# Patient Record
Sex: Male | Born: 1975 | Race: Black or African American | Hispanic: No | Marital: Single | Smoking: Current every day smoker
Health system: Southern US, Community
[De-identification: ages and names within clinical notes are randomized; demographics above are authoritative.]

## PROBLEM LIST (undated history)

## (undated) DIAGNOSIS — F250 Schizoaffective disorder, bipolar type: Secondary | ICD-10-CM

## (undated) DIAGNOSIS — W3400XA Accidental discharge from unspecified firearms or gun, initial encounter: Secondary | ICD-10-CM

## (undated) DIAGNOSIS — F209 Schizophrenia, unspecified: Secondary | ICD-10-CM

## (undated) DIAGNOSIS — E119 Type 2 diabetes mellitus without complications: Secondary | ICD-10-CM

## (undated) HISTORY — PX: OTHER SURGICAL HISTORY: SHX169

---

## 2005-05-01 ENCOUNTER — Emergency Department: Payer: Self-pay | Admitting: Emergency Medicine

## 2006-04-27 ENCOUNTER — Inpatient Hospital Stay: Payer: Self-pay | Admitting: Unknown Physician Specialty

## 2007-10-04 ENCOUNTER — Emergency Department: Payer: Self-pay | Admitting: Emergency Medicine

## 2007-12-24 ENCOUNTER — Inpatient Hospital Stay: Payer: Self-pay | Admitting: Unknown Physician Specialty

## 2008-09-08 ENCOUNTER — Inpatient Hospital Stay: Payer: Self-pay | Admitting: Unknown Physician Specialty

## 2008-10-31 ENCOUNTER — Inpatient Hospital Stay: Payer: Self-pay | Admitting: Psychiatry

## 2009-05-13 ENCOUNTER — Ambulatory Visit: Payer: Self-pay

## 2010-02-02 ENCOUNTER — Inpatient Hospital Stay: Payer: Self-pay | Admitting: Psychiatry

## 2010-03-28 ENCOUNTER — Inpatient Hospital Stay: Payer: Self-pay | Admitting: Psychiatry

## 2012-04-29 ENCOUNTER — Emergency Department: Payer: Self-pay | Admitting: Emergency Medicine

## 2012-04-29 LAB — COMPREHENSIVE METABOLIC PANEL
Albumin: 3.6 g/dL (ref 3.4–5.0)
Alkaline Phosphatase: 86 U/L (ref 50–136)
Anion Gap: 18 — ABNORMAL HIGH (ref 7–16)
BUN: 13 mg/dL (ref 7–18)
Bilirubin,Total: 0.2 mg/dL (ref 0.2–1.0)
Calcium, Total: 8 mg/dL — ABNORMAL LOW (ref 8.5–10.1)
Chloride: 110 mmol/L — ABNORMAL HIGH (ref 98–107)
EGFR (African American): 58 — ABNORMAL LOW
SGOT(AST): 179 U/L — ABNORMAL HIGH (ref 15–37)
SGPT (ALT): 167 U/L — ABNORMAL HIGH
Sodium: 145 mmol/L (ref 136–145)

## 2012-04-29 LAB — CBC WITH DIFFERENTIAL/PLATELET
Basophil #: 0.2 10*3/uL — ABNORMAL HIGH (ref 0.0–0.1)
Eosinophil #: 0 10*3/uL (ref 0.0–0.7)
HCT: 39.8 % — ABNORMAL LOW (ref 40.0–52.0)
HGB: 13.3 g/dL (ref 13.0–18.0)
Lymphocyte #: 3.7 10*3/uL — ABNORMAL HIGH (ref 1.0–3.6)
MCHC: 33.6 g/dL (ref 32.0–36.0)
MCV: 87 fL (ref 80–100)
Monocyte #: 0.6 x10 3/mm (ref 0.2–1.0)
Monocyte %: 8 %
Neutrophil %: 43.5 %
Platelet: 193 10*3/uL (ref 150–440)
WBC: 8.1 10*3/uL (ref 3.8–10.6)

## 2012-04-29 LAB — APTT: Activated PTT: 31.4 secs (ref 23.6–35.9)

## 2012-04-29 LAB — PROTIME-INR: INR: 1

## 2012-05-07 ENCOUNTER — Emergency Department: Payer: Self-pay | Admitting: Emergency Medicine

## 2012-05-07 LAB — CBC
MCH: 27.1 pg (ref 26.0–34.0)
MCHC: 31.9 g/dL — ABNORMAL LOW (ref 32.0–36.0)
MCV: 85 fL (ref 80–100)
Platelet: 251 10*3/uL (ref 150–440)
RBC: 3.4 10*6/uL — ABNORMAL LOW (ref 4.40–5.90)
RDW: 15.4 % — ABNORMAL HIGH (ref 11.5–14.5)
WBC: 13.5 10*3/uL — ABNORMAL HIGH (ref 3.8–10.6)

## 2012-05-07 LAB — URINALYSIS, COMPLETE
Bacteria: NONE SEEN
Leukocyte Esterase: NEGATIVE
Protein: NEGATIVE
Specific Gravity: 1.017 (ref 1.003–1.030)
Squamous Epithelial: 1
WBC UR: 10 /HPF (ref 0–5)

## 2012-05-07 LAB — COMPREHENSIVE METABOLIC PANEL
Alkaline Phosphatase: 137 U/L — ABNORMAL HIGH (ref 50–136)
BUN: 11 mg/dL (ref 7–18)
Calcium, Total: 8.3 mg/dL — ABNORMAL LOW (ref 8.5–10.1)
Creatinine: 1.06 mg/dL (ref 0.60–1.30)
Potassium: 4.3 mmol/L (ref 3.5–5.1)
SGOT(AST): 51 U/L — ABNORMAL HIGH (ref 15–37)

## 2012-05-11 ENCOUNTER — Emergency Department: Payer: Self-pay | Admitting: Emergency Medicine

## 2012-05-11 LAB — URINALYSIS, COMPLETE
Bilirubin,UR: NEGATIVE
Ketone: NEGATIVE
Nitrite: NEGATIVE
RBC,UR: 1 /HPF (ref 0–5)
Specific Gravity: 1.009 (ref 1.003–1.030)

## 2012-05-11 LAB — CBC WITH DIFFERENTIAL/PLATELET
Eosinophil %: 0.6 %
HCT: 27.4 % — ABNORMAL LOW (ref 40.0–52.0)
HGB: 9.2 g/dL — ABNORMAL LOW (ref 13.0–18.0)
Lymphocyte #: 1.4 10*3/uL (ref 1.0–3.6)
Lymphocyte %: 10.8 %
MCHC: 33.5 g/dL (ref 32.0–36.0)
MCV: 84 fL (ref 80–100)
Monocyte #: 1.6 x10 3/mm — ABNORMAL HIGH (ref 0.2–1.0)
Neutrophil #: 9.7 10*3/uL — ABNORMAL HIGH (ref 1.4–6.5)
Neutrophil %: 75.2 %
RBC: 3.27 10*6/uL — ABNORMAL LOW (ref 4.40–5.90)
RDW: 15.5 % — ABNORMAL HIGH (ref 11.5–14.5)
WBC: 12.9 10*3/uL — ABNORMAL HIGH (ref 3.8–10.6)

## 2012-05-11 LAB — COMPREHENSIVE METABOLIC PANEL
Albumin: 2.3 g/dL — ABNORMAL LOW (ref 3.4–5.0)
Alkaline Phosphatase: 147 U/L — ABNORMAL HIGH (ref 50–136)
Bilirubin,Total: 0.5 mg/dL (ref 0.2–1.0)
Calcium, Total: 8.3 mg/dL — ABNORMAL LOW (ref 8.5–10.1)
EGFR (Non-African Amer.): >60
Glucose: 110 mg/dL — ABNORMAL HIGH (ref 65–99)
Potassium: 4.5 mmol/L (ref 3.5–5.1)
SGOT(AST): 57 U/L — ABNORMAL HIGH (ref 15–37)
SGPT (ALT): 74 U/L
Sodium: 137 mmol/L (ref 136–145)
Total Protein: 7.1 g/dL (ref 6.4–8.2)

## 2012-05-13 LAB — CULTURE, BLOOD (SINGLE)

## 2012-05-16 ENCOUNTER — Emergency Department: Payer: Self-pay | Admitting: Emergency Medicine

## 2012-05-17 LAB — CBC
HGB: 10.3 g/dL — ABNORMAL LOW (ref 13.0–18.0)
MCH: 27.6 pg (ref 26.0–34.0)
MCV: 81 fL (ref 80–100)
Platelet: 519 10*3/uL — ABNORMAL HIGH (ref 150–440)
RBC: 3.72 10*6/uL — ABNORMAL LOW (ref 4.40–5.90)
RDW: 16.1 % — ABNORMAL HIGH (ref 11.5–14.5)
WBC: 10.1 10*3/uL (ref 3.8–10.6)

## 2012-05-17 LAB — COMPREHENSIVE METABOLIC PANEL
Albumin: 2.8 g/dL — ABNORMAL LOW (ref 3.4–5.0)
Bilirubin,Total: 0.3 mg/dL (ref 0.2–1.0)
Chloride: 101 mmol/L (ref 98–107)
Co2: 27 mmol/L (ref 21–32)
EGFR (African American): >60
Glucose: 111 mg/dL — ABNORMAL HIGH (ref 65–99)
SGOT(AST): 26 U/L (ref 15–37)
Sodium: 137 mmol/L (ref 136–145)
Total Protein: 8.1 g/dL (ref 6.4–8.2)

## 2012-05-17 LAB — CULTURE, BLOOD (SINGLE)

## 2012-05-25 ENCOUNTER — Emergency Department: Payer: Self-pay | Admitting: Emergency Medicine

## 2012-05-25 LAB — COMPREHENSIVE METABOLIC PANEL
Anion Gap: 7 (ref 7–16)
BUN: 10 mg/dL (ref 7–18)
Bilirubin,Total: 0.6 mg/dL (ref 0.2–1.0)
Chloride: 105 mmol/L (ref 98–107)
Co2: 26 mmol/L (ref 21–32)
Creatinine: 1.08 mg/dL (ref 0.60–1.30)
EGFR (African American): >60
EGFR (Non-African Amer.): >60
Potassium: 3.6 mmol/L (ref 3.5–5.1)
Total Protein: 7.8 g/dL (ref 6.4–8.2)

## 2012-05-25 LAB — URINALYSIS, COMPLETE
Bacteria: NONE SEEN
Bilirubin,UR: NEGATIVE
Leukocyte Esterase: NEGATIVE
Ph: 5 (ref 4.5–8.0)
Protein: NEGATIVE
RBC,UR: 2 /HPF (ref 0–5)
Squamous Epithelial: NONE SEEN

## 2012-05-25 LAB — CBC WITH DIFFERENTIAL/PLATELET
Basophil %: 0.7 %
Eosinophil #: 0.1 10*3/uL (ref 0.0–0.7)
Eosinophil %: 1 %
HGB: 11.4 g/dL — ABNORMAL LOW (ref 13.0–18.0)
Lymphocyte #: 1.7 10*3/uL (ref 1.0–3.6)
Lymphocyte %: 17.1 %
MCV: 81 fL (ref 80–100)
Monocyte %: 12 %
Neutrophil #: 6.9 10*3/uL — ABNORMAL HIGH (ref 1.4–6.5)
WBC: 10 10*3/uL (ref 3.8–10.6)

## 2012-07-05 ENCOUNTER — Emergency Department: Payer: Self-pay | Admitting: Emergency Medicine

## 2012-07-05 LAB — COMPREHENSIVE METABOLIC PANEL
Alkaline Phosphatase: 103 U/L (ref 50–136)
Anion Gap: 11 (ref 7–16)
EGFR (African American): >60
EGFR (Non-African Amer.): >60
Glucose: 120 mg/dL — ABNORMAL HIGH (ref 65–99)
Potassium: 3.3 mmol/L — ABNORMAL LOW (ref 3.5–5.1)
SGOT(AST): 20 U/L (ref 15–37)
SGPT (ALT): 19 U/L (ref 12–78)
Sodium: 142 mmol/L (ref 136–145)
Total Protein: 7.7 g/dL (ref 6.4–8.2)

## 2012-07-05 LAB — CBC
HCT: 39.8 % — ABNORMAL LOW
HGB: 13.6 g/dL
MCH: 26.6 pg
MCHC: 34.1 g/dL
MCV: 78 fL — ABNORMAL LOW
Platelet: 189 x10 3/mm 3
RBC: 5.11 x10 6/mm 3
RDW: 19.2 % — ABNORMAL HIGH
WBC: 9.6 x10 3/mm 3

## 2012-07-05 LAB — URINALYSIS, COMPLETE
Bacteria: NONE SEEN
Bilirubin,UR: NEGATIVE
Glucose,UR: NEGATIVE mg/dL (ref 0–75)
Ketone: NEGATIVE
Leukocyte Esterase: NEGATIVE
Protein: NEGATIVE
RBC,UR: 1 /HPF (ref 0–5)
Specific Gravity: 1.014 (ref 1.003–1.030)
Squamous Epithelial: NONE SEEN
WBC UR: 1 /HPF (ref 0–5)

## 2012-07-05 LAB — LIPASE, BLOOD: Lipase: 170 U/L

## 2012-07-30 LAB — DRUG SCREEN, URINE
Barbiturates, Ur Screen: NEGATIVE (ref ?–200)
Cannabinoid 50 Ng, Ur ~~LOC~~: POSITIVE (ref ?–50)
Cocaine Metabolite,Ur ~~LOC~~: POSITIVE (ref ?–300)
Opiate, Ur Screen: NEGATIVE (ref ?–300)
Tricyclic, Ur Screen: NEGATIVE (ref ?–1000)

## 2012-07-30 LAB — COMPREHENSIVE METABOLIC PANEL
Albumin: 4.3 g/dL (ref 3.4–5.0)
Alkaline Phosphatase: 89 U/L (ref 50–136)
Anion Gap: 10 (ref 7–16)
Calcium, Total: 9.3 mg/dL (ref 8.5–10.1)
Chloride: 103 mmol/L (ref 98–107)
Co2: 24 mmol/L (ref 21–32)
EGFR (African American): >60
EGFR (Non-African Amer.): >60
Glucose: 90 mg/dL (ref 65–99)
Potassium: 3.6 mmol/L (ref 3.5–5.1)
SGOT(AST): 22 U/L (ref 15–37)
Total Protein: 8.6 g/dL — ABNORMAL HIGH (ref 6.4–8.2)

## 2012-07-30 LAB — CBC
HCT: 43.3 % (ref 40.0–52.0)
HGB: 14.9 g/dL (ref 13.0–18.0)
MCV: 78 fL — ABNORMAL LOW (ref 80–100)
Platelet: 231 10*3/uL (ref 150–440)
RBC: 5.59 10*6/uL (ref 4.40–5.90)
RDW: 20.6 % — ABNORMAL HIGH (ref 11.5–14.5)
WBC: 10.7 10*3/uL — ABNORMAL HIGH (ref 3.8–10.6)

## 2012-07-30 LAB — ETHANOL: Ethanol %: <0.003 % (ref 0.000–0.080)

## 2012-07-30 LAB — SALICYLATE LEVEL: Salicylates, Serum: <1.7 mg/dL

## 2012-07-31 ENCOUNTER — Inpatient Hospital Stay: Payer: Self-pay | Admitting: Psychiatry

## 2012-07-31 LAB — URINALYSIS, COMPLETE
Bilirubin,UR: NEGATIVE
Blood: NEGATIVE
Ketone: NEGATIVE
Leukocyte Esterase: NEGATIVE
Nitrite: NEGATIVE
Ph: 5 (ref 4.5–8.0)
RBC,UR: NONE SEEN /HPF (ref 0–5)
Specific Gravity: 1.015 (ref 1.003–1.030)
Squamous Epithelial: NONE SEEN

## 2012-08-01 LAB — BEHAVIORAL MEDICINE 1 PANEL
Albumin: 3.2 g/dL — ABNORMAL LOW (ref 3.4–5.0)
Alkaline Phosphatase: 93 U/L (ref 50–136)
BUN: 12 mg/dL (ref 7–18)
Basophil %: 0.8 %
Bilirubin,Total: 0.4 mg/dL (ref 0.2–1.0)
Chloride: 108 mmol/L — ABNORMAL HIGH (ref 98–107)
Co2: 23 mmol/L (ref 21–32)
Creatinine: 1.05 mg/dL (ref 0.60–1.30)
Eosinophil %: 3 %
Glucose: 101 mg/dL — ABNORMAL HIGH (ref 65–99)
HCT: 39.2 % — ABNORMAL LOW (ref 40.0–52.0)
HGB: 13.2 g/dL (ref 13.0–18.0)
Lymphocyte #: 2.4 10*3/uL (ref 1.0–3.6)
MCH: 26.3 pg (ref 26.0–34.0)
Monocyte %: 10.3 %
Neutrophil #: 2.5 10*3/uL (ref 1.4–6.5)
Neutrophil %: 43.7 %
Osmolality: 279 (ref 275–301)
Platelet: 167 10*3/uL (ref 150–440)
Potassium: 3.7 mmol/L (ref 3.5–5.1)
RBC: 5 10*6/uL (ref 4.40–5.90)
SGOT(AST): 22 U/L (ref 15–37)
Sodium: 140 mmol/L (ref 136–145)
WBC: 5.7 10*3/uL (ref 3.8–10.6)

## 2012-09-01 ENCOUNTER — Inpatient Hospital Stay: Payer: Self-pay | Admitting: Psychiatry

## 2012-09-01 LAB — CBC
MCH: 27.2 pg (ref 26.0–34.0)
MCHC: 34.6 g/dL (ref 32.0–36.0)
MCV: 79 fL — ABNORMAL LOW (ref 80–100)
Platelet: 227 10*3/uL (ref 150–440)
RBC: 5.3 10*6/uL (ref 4.40–5.90)
RDW: 20.8 % — ABNORMAL HIGH (ref 11.5–14.5)
WBC: 8.3 10*3/uL (ref 3.8–10.6)

## 2012-09-01 LAB — URINALYSIS, COMPLETE
Bilirubin,UR: NEGATIVE
Glucose,UR: 50 mg/dL (ref 0–75)
Leukocyte Esterase: NEGATIVE
Nitrite: NEGATIVE
Ph: 6 (ref 4.5–8.0)
Protein: >=500
RBC,UR: 8 /HPF (ref 0–5)
Squamous Epithelial: 2
WBC UR: 19 /HPF (ref 0–5)
White Blood Cell Cast: 11

## 2012-09-01 LAB — DRUG SCREEN, URINE
Amphetamines, Ur Screen: NEGATIVE (ref ?–1000)
Barbiturates, Ur Screen: NEGATIVE (ref ?–200)
Benzodiazepine, Ur Scrn: NEGATIVE (ref ?–200)
Cannabinoid 50 Ng, Ur ~~LOC~~: POSITIVE (ref ?–50)
Methadone, Ur Screen: NEGATIVE (ref ?–300)
Opiate, Ur Screen: NEGATIVE (ref ?–300)
Phencyclidine (PCP) Ur S: NEGATIVE (ref ?–25)
Tricyclic, Ur Screen: NEGATIVE (ref ?–1000)

## 2012-09-01 LAB — COMPREHENSIVE METABOLIC PANEL
Albumin: 4.3 g/dL (ref 3.4–5.0)
Alkaline Phosphatase: 87 U/L (ref 50–136)
Anion Gap: 12 (ref 7–16)
BUN: 12 mg/dL (ref 7–18)
Co2: 21 mmol/L (ref 21–32)
EGFR (African American): 58 — ABNORMAL LOW
Glucose: 114 mg/dL — ABNORMAL HIGH (ref 65–99)
Potassium: 3.3 mmol/L — ABNORMAL LOW (ref 3.5–5.1)
SGOT(AST): 24 U/L (ref 15–37)
SGPT (ALT): 18 U/L (ref 12–78)
Sodium: 141 mmol/L (ref 136–145)
Total Protein: 8.3 g/dL — ABNORMAL HIGH (ref 6.4–8.2)

## 2012-09-01 LAB — ETHANOL
Ethanol %: <0.003 % (ref 0.000–0.080)
Ethanol: <3 mg/dL

## 2012-09-02 DIAGNOSIS — I499 Cardiac arrhythmia, unspecified: Secondary | ICD-10-CM

## 2012-09-02 LAB — BASIC METABOLIC PANEL
Anion Gap: 8 (ref 7–16)
BUN: 12 mg/dL (ref 7–18)
Calcium, Total: 8.9 mg/dL (ref 8.5–10.1)
Chloride: 110 mmol/L — ABNORMAL HIGH (ref 98–107)
Creatinine: 1.29 mg/dL (ref 0.60–1.30)
EGFR (African American): >60
EGFR (Non-African Amer.): >60
Glucose: 103 mg/dL — ABNORMAL HIGH (ref 65–99)
Osmolality: 283 (ref 275–301)
Potassium: 3.8 mmol/L (ref 3.5–5.1)

## 2012-09-02 LAB — LIPID PANEL
Cholesterol: 120 mg/dL (ref 0–200)
Triglycerides: 99 mg/dL (ref 0–200)
VLDL Cholesterol, Calc: 20 mg/dL (ref 5–40)

## 2012-09-02 LAB — CK: CK, Total: 299 U/L — ABNORMAL HIGH (ref 35–232)

## 2012-09-02 LAB — FOLATE: Folic Acid: 12.9 ng/mL (ref 3.1–100.0)

## 2012-09-03 LAB — URINALYSIS, COMPLETE
Bacteria: NONE SEEN
Bilirubin,UR: NEGATIVE
Glucose,UR: NEGATIVE mg/dL (ref 0–75)
Nitrite: NEGATIVE
Ph: 5 (ref 4.5–8.0)
Protein: NEGATIVE
RBC,UR: <1 /HPF (ref 0–5)
Specific Gravity: 1.014 (ref 1.003–1.030)
Squamous Epithelial: NONE SEEN

## 2013-02-04 ENCOUNTER — Inpatient Hospital Stay: Payer: Self-pay | Admitting: Psychiatry

## 2013-02-04 LAB — COMPREHENSIVE METABOLIC PANEL
Albumin: 4.2 g/dL (ref 3.4–5.0)
BUN: 16 mg/dL (ref 7–18)
Bilirubin,Total: 0.5 mg/dL (ref 0.2–1.0)
Chloride: 106 mmol/L (ref 98–107)
Co2: 23 mmol/L (ref 21–32)
Creatinine: 1.41 mg/dL — ABNORMAL HIGH (ref 0.60–1.30)
EGFR (African American): >60
EGFR (Non-African Amer.): >60
Sodium: 136 mmol/L (ref 136–145)
Total Protein: 8.3 g/dL — ABNORMAL HIGH (ref 6.4–8.2)

## 2013-02-04 LAB — URINALYSIS, COMPLETE
Bacteria: NONE SEEN
Nitrite: NEGATIVE
Ph: 5 (ref 4.5–8.0)
Protein: NEGATIVE
Specific Gravity: 1.012 (ref 1.003–1.030)
Squamous Epithelial: NONE SEEN

## 2013-02-04 LAB — ETHANOL
Ethanol %: 0.007 % (ref 0.000–0.080)
Ethanol: 7 mg/dL

## 2013-02-04 LAB — DRUG SCREEN, URINE
Amphetamines, Ur Screen: NEGATIVE (ref ?–1000)
Barbiturates, Ur Screen: NEGATIVE (ref ?–200)
Benzodiazepine, Ur Scrn: NEGATIVE (ref ?–200)
Cannabinoid 50 Ng, Ur ~~LOC~~: POSITIVE (ref ?–50)
Cocaine Metabolite,Ur ~~LOC~~: POSITIVE (ref ?–300)
MDMA (Ecstasy)Ur Screen: NEGATIVE (ref ?–500)
Methadone, Ur Screen: NEGATIVE (ref ?–300)
Opiate, Ur Screen: NEGATIVE (ref ?–300)
Phencyclidine (PCP) Ur S: NEGATIVE (ref ?–25)
Tricyclic, Ur Screen: NEGATIVE (ref ?–1000)

## 2013-02-04 LAB — SALICYLATE LEVEL: Salicylates, Serum: 3 mg/dL — ABNORMAL HIGH

## 2013-02-04 LAB — CBC
HCT: 43.8 % (ref 40.0–52.0)
HGB: 15 g/dL (ref 13.0–18.0)
MCH: 29.2 pg (ref 26.0–34.0)
MCHC: 34.3 g/dL (ref 32.0–36.0)
MCV: 85 fL (ref 80–100)
Platelet: 199 10*3/uL (ref 150–440)
RBC: 5.16 10*6/uL (ref 4.40–5.90)
RDW: 14.9 % — ABNORMAL HIGH (ref 11.5–14.5)

## 2013-02-04 LAB — ACETAMINOPHEN LEVEL: Acetaminophen: <2 ug/mL

## 2013-02-04 LAB — TSH: Thyroid Stimulating Horm: 3.54 u[IU]/mL
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
## 2013-05-01 ENCOUNTER — Emergency Department: Payer: Self-pay | Admitting: Emergency Medicine

## 2013-06-05 LAB — CBC
MCHC: 34.5 g/dL (ref 32.0–36.0)
MCV: 85 fL (ref 80–100)
RBC: 4.84 10*6/uL (ref 4.40–5.90)
RDW: 14.8 % — ABNORMAL HIGH (ref 11.5–14.5)

## 2013-06-05 LAB — DRUG SCREEN, URINE
Amphetamines, Ur Screen: NEGATIVE (ref ?–1000)
Barbiturates, Ur Screen: NEGATIVE (ref ?–200)
Benzodiazepine, Ur Scrn: NEGATIVE (ref ?–200)
Cannabinoid 50 Ng, Ur ~~LOC~~: POSITIVE (ref ?–50)
MDMA (Ecstasy)Ur Screen: NEGATIVE (ref ?–500)
Methadone, Ur Screen: NEGATIVE (ref ?–300)
Phencyclidine (PCP) Ur S: NEGATIVE (ref ?–25)
Tricyclic, Ur Screen: NEGATIVE (ref ?–1000)

## 2013-06-05 LAB — URINALYSIS, COMPLETE
Bacteria: NONE SEEN
Bilirubin,UR: NEGATIVE
Ketone: NEGATIVE
Nitrite: NEGATIVE
Ph: 5 (ref 4.5–8.0)
Protein: NEGATIVE
RBC,UR: 1 /HPF (ref 0–5)
Squamous Epithelial: NONE SEEN
WBC UR: 2 /HPF (ref 0–5)

## 2013-06-05 LAB — COMPREHENSIVE METABOLIC PANEL
Alkaline Phosphatase: 79 U/L (ref 50–136)
Bilirubin,Total: 0.5 mg/dL (ref 0.2–1.0)
Calcium, Total: 9.1 mg/dL (ref 8.5–10.1)
Creatinine: 1.34 mg/dL — ABNORMAL HIGH (ref 0.60–1.30)
EGFR (African American): >60
EGFR (Non-African Amer.): >60
Glucose: 92 mg/dL (ref 65–99)
Potassium: 3.5 mmol/L (ref 3.5–5.1)
SGOT(AST): 19 U/L (ref 15–37)
SGPT (ALT): 21 U/L (ref 12–78)
Sodium: 139 mmol/L (ref 136–145)
Total Protein: 8.2 g/dL (ref 6.4–8.2)

## 2013-06-05 LAB — ETHANOL
Ethanol %: <0.003 % (ref 0.000–0.080)
Ethanol: <3 mg/dL

## 2013-06-05 LAB — TSH: Thyroid Stimulating Horm: 2.57 u[IU]/mL

## 2013-06-05 LAB — ACETAMINOPHEN LEVEL: Acetaminophen: <2 ug/mL

## 2013-06-06 ENCOUNTER — Inpatient Hospital Stay: Payer: Self-pay | Admitting: Psychiatry

## 2013-07-24 ENCOUNTER — Emergency Department: Payer: Self-pay | Admitting: Emergency Medicine

## 2013-10-05 ENCOUNTER — Emergency Department: Payer: Self-pay | Admitting: Emergency Medicine
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
## 2015-03-16 NOTE — Consult Note (Signed)
PATIENT NAME:  Cory PortelaMCLEAN, Cory J MR#:  [id number] DATE OF BIRTH:  [birth year]  DATE OF CONSULTATION:  06/06/2013  PSYCHIATRIC NOTE  REFERRING PHYSICIAN:    CONSULTING PHYSICIAN:  Izola PriceFrances C. Jaclynn MajorGreason, MD  HISTORY: Mr. [name] was seen in consultation on 06/05/2013 by Dr. Guss Bundehalla in the ED. He is a 39 year old male with a long history of mental problems. He reports that he [place] and he has not been taking his medicines. He has recently moved back to [place] and is living with his uncle, so he can keep his follow up appointments with [facility]are [facility]Lavine [facility]. He had come to the ED because he was hearing voices telling him to hurt himself because he is no good. He has a long history of psychosis and he has a history of suicide attempts. He confirms feeling depressed on interview, hearing voices, telling him he is no good. Having poor sleep, feeling apathetic and depressed. He has an idea and plan to OD by taking pills. His behavior is unpredictable. During the time I am talking to him, he has a blanket over his head. His insight and judgment are poor.   DIAGNOSES: Polysubstance abuse, psychosis not otherwise specified.   RECOMMENDATION:  Inpatient [facility] when a bed is available.    ____________________________ Izola PriceFrances C. Jaclynn MajorGreason, MD fcg:rw D: 06/06/2013 30:86:[id number]:28:24 ET T: 06/06/2013 16:58:50 ET JOB#: [id number]  cc: Izola PriceFrances C. Jaclynn MajorGreason, MD, <Dictator> Maryan PulsFRANCES C Marice Guidone MD ELECTRONICALLY SIGNED 06/08/2013 14:41

## 2015-03-16 NOTE — H&P (Signed)
PATIENT NAME:  Cory Pruitt, Cory Pruitt MR#:  [id number] DATE OF BIRTH:  08-Aug-1976  DATE OF ADMISSION:  06/06/2013  DATE OF ASSESSMENT:  06/07/2013  REFERRING PHYSICIAN:  Emergency Room MD.   ATTENDING PHYSICIAN:  Kristine LineaJolanta Aunya Lemler, MD.   IDENTIFYING DATA: Mr. Cory Pruitt is a 39 year old male with history of schizoaffective disorder and substance abuse.   CHIEF COMPLAINT:  "I need my medication."  HISTORY OF PRESENT ILLNESS:  Mr. Cory Pruitt reports that he has been off his medication since April or May 2014. He lives in [place] now and has had difficulties attending his appointments. Overall, he had done fairly well, but now he feels increasingly depressed and is afraid that without medication, he would slip into deep depression. He reports periods of time when he feels on the top of the world lasting about a week, followed by depressed mood, when he is unable to get out of bed, have any social interaction, periods of depression usually associated with a craving for substances, mostly cocaine. The patient is positive for metabolites of cocaine and cannabis on admission. He denies excessive alcohol use.  At the time of admission, the patient voiced some suicidal ideation but now he explains that this was a way to get to the hospital to get medications rather to be treated for depression. He wants to restart the Geodon but even more so he is interested in taking medication that comes as an injectable long-lasting form. At the moment he denies any thoughts of hurting himself or others. There are no psychotic symptoms yet. There are no symptoms suggestive of bipolar mania.   PAST PSYCHIATRIC HISTORY:  He was diagnosed with schizoaffective disorder in 2004. He was [facility] admitted to [facility]. He was treated with Risperdal that worked well for him, but made him sedated. He had numerous, about 10 admissions to the hospital, mostly [facility] but also [facility]. He is  admitted to the hospital mostly in the context of medication noncompliance and substance use. He has been tried on several medications including Geodon, Depakote, trazodone, Zyprexa. He recently was given a 7-day supply of medication that he does not remember the name of. He has never been treated for substance abuse and does not believe that this is a problem as long as he is on medication for his schizoaffective disorder.  In the past, he used to work with [facility] ACT team but reports that he was discharged from their care as he was doing very, very well.   FAMILY PSYCHIATRIC HISTORY:  Mother with schizophrenia and father with bipolar.   PAST MEDICAL HISTORY:  None.   ALLERGIES:  No known drug allergies.   MEDICATIONS ON ADMISSION:  None.   SOCIAL HISTORY:  He dropped out of high school but did get his GED. He has been employed some but had to stop working due to mental disability. He is single and live lives in [place] with his girlfriend and 6172-month-old baby. He has fathered 6 other children and sees his 38-year-old on weekends. He is not in touch with other children. He applied for Section 8 Housing in [place] and has a top priority and hopes to relocate to our area soon, which will allow him to [place] care [place].  There are no legal charges.  REVIEW OF SYSTEMS:  CONSTITUTIONAL:  No fevers or chills. No weight changes.  EYES:  No double or blurred vision.  ENT:  No hearing losses.  RESPIRATORY:  No  shortness of breath or cough.  CARDIOVASCULAR:  No chest pain or orthopnea.  GASTROINTESTINAL:  No abdominal pain, nausea, vomiting or diarrhea.  GENITOURINARY:  No incontinence or frequency.  ENDOCRINE:  No heat or cold intolerance.  LYMPHATIC:  No anemia or easy bruising.  INTEGUMENTARY:  No acne or rash.  MUSCULOSKELETAL:  No muscle or joint pain.  NEUROLOGIC:  No tingling or weakness. PSYCHIATRIC:  See history of present illness for details.   PHYSICAL  EXAMINATION: VITAL SIGNS:  Blood pressure 140/82, pulse 99, respirations 18, temperature 98.  GENERAL:  This is a well-developed male in no acute distress.  HEENT:  The pupils are equal, round and reactive to light. Sclerae are anicteric.  NECK:  Supple. No thyromegaly.  LUNGS:  Clear to auscultation. No dullness to percussion.  HEART:  Regular rhythm and rate. No murmurs, rubs or gallops.  ABDOMEN:  Soft, nontender, nondistended. Positive bowel sounds.  MUSCULOSKELETAL:  Normal muscle strength in all extremities.  SKIN:  No rashes or bruises.  LYMPHATIC:  No cervical adenopathy.  NEUROLOGIC:  Cranial nerves II through XII are intact.   LABORATORY DATA: Chemistries are within normal limits except for creatinine of 1.34. Blood alcohol level is zero. LFTs within normal limits. TSH 2.57. Urine tox screen positive for cocaine and cannabinoids. CBC within normal limits. Urinalysis is not suggestive of urinary tract infection. Serum acetaminophen less than 2. Salicylates 2.6.  MENTAL STATUS EXAMINATION ON ADMISSION:  The patient is alert and oriented to person, place, time and situation. He is pleasant, polite and cooperative, rather talkative, quite intelligent. He is well groomed and casually dressed. He maintains excellent eye contact. His speech is slightly pressured. Mood is fine but "getting depressed" with full, even exuberant affect. Thought processing is logical and goal oriented thought content. He denies suicidal or homicidal ideation. There are no delusions or paranoia. He denies auditory or visual hallucinations. His cognition is grossly intact. He registers 3 out of 3 and recalls 3 out of 3 objects after 5 minutes. He can spell well forward and backward. He knows the current president. His insight and judgment are good.  SUICIDE RISK ASSESSMENT ON ADMISSION:  This is a patient with long history of schizoaffective disorder, substance abuse and treatment noncompliance who was admitted to the  hospital for worsening depression and possibly suicidal ideation in the context of discontinuation of his medications. He is able to contract for safety in the hospital.   DIAGNOSES: AXIS I:  Schizoaffective disorder bipolar type, cocaine abuse, marijuana abuse.   AXIS II:  Deferred.   AXIS III:  Deferred.   AXIS IV:  Mental illness, substance abuse, treatment compliance, access to care.   AXIS V:  GAF on admission 30.   PLAN:  The patient was admitted to [facility] for safety stabilization and medication management. He was initially placed on suicide precautions and was closely monitored for any unsafe behavior. He underwent full psychiatric and risk assessment. He received pharmacotherapy, individual and group psychotherapy, substance abuse counseling and support from therapeutic milieu.  1.  Suicidal ideation, but the patient denies. 2.  Psychosis.  The patient is ready to restart Geodon but he insists that he would prefer to be on longer injectable medication. We will start Invega oral 6 mg a day. If the patient tolerates it well we will give an injection of (Dictation Anomaly). We will restart trazodone for sleep. 3.  Substance abuse.  The patient denies heavy substance use  and feels that once on medication, he will be able to maintain sobriety.  He declines residential substance abuse treatment.   DISPOSITION:  He will be discharged to home.  We will make an appointment in [place]. Hopefully, he will relocate to our area soon.   ____________________________ Ellin Goodie Jennet Maduro, MD jbp:ce D: 06/07/2013 12:29:43 ET T: 06/07/2013 13:05:48 ET JOB#: [id number]  cc: Aminah Zabawa B. Jennet Maduro, MD, <Dictator> Shari Prows MD ELECTRONICALLY SIGNED 06/16/2013 20:18
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
## 2015-06-03 ENCOUNTER — Encounter: Payer: Self-pay | Admitting: Emergency Medicine

## 2015-06-03 ENCOUNTER — Other Ambulatory Visit: Payer: Self-pay

## 2015-06-03 ENCOUNTER — Emergency Department
Admission: EM | Admit: 2015-06-03 | Discharge: 2015-06-03 | Disposition: A | Discharge status: Home / Self Care | Payer: Medicaid Other | Attending: Emergency Medicine | Admitting: Emergency Medicine

## 2015-06-03 ENCOUNTER — Emergency Department: Payer: Medicaid Other

## 2015-06-03 DIAGNOSIS — R32 Unspecified urinary incontinence: Secondary | ICD-10-CM | POA: Diagnosis not present

## 2015-06-03 DIAGNOSIS — K859 Acute pancreatitis, unspecified: Secondary | ICD-10-CM | POA: Diagnosis not present

## 2015-06-03 DIAGNOSIS — Z720 Tobacco use: Secondary | ICD-10-CM | POA: Diagnosis not present

## 2015-06-03 DIAGNOSIS — R197 Diarrhea, unspecified: Secondary | ICD-10-CM | POA: Insufficient documentation

## 2015-06-03 HISTORY — DX: Accidental discharge from unspecified firearms or gun, initial encounter: W34.00XA

## 2015-06-03 LAB — COMPREHENSIVE METABOLIC PANEL
ALBUMIN: 4.1 g/dL (ref 3.5–5.0)
ALT: 18 U/L (ref 17–63)
AST: 19 U/L (ref 15–41)
Alkaline Phosphatase: 59 U/L (ref 38–126)
Anion gap: 9 (ref 5–15)
BUN: 10 mg/dL (ref 6–20)
CALCIUM: 8.9 mg/dL (ref 8.9–10.3)
CO2: 24 mmol/L (ref 22–32)
Chloride: 104 mmol/L (ref 101–111)
Creatinine, Ser: 1.31 mg/dL — ABNORMAL HIGH (ref 0.61–1.24)
GFR calc Af Amer: >60 mL/min (ref >60)
GFR calc non Af Amer: >60 mL/min (ref >60)
Glucose, Bld: 136 mg/dL — ABNORMAL HIGH (ref 65–99)
Potassium: 3.4 mmol/L — ABNORMAL LOW (ref 3.5–5.1)
Sodium: 137 mmol/L (ref 135–145)
TOTAL PROTEIN: 7.3 g/dL (ref 6.5–8.1)
Total Bilirubin: 0.4 mg/dL (ref 0.3–1.2)

## 2015-06-03 LAB — URINALYSIS COMPLETE WITH MICROSCOPIC (ARMC ONLY)
Bacteria, UA: NONE SEEN
Bilirubin Urine: NEGATIVE
Glucose, UA: NEGATIVE mg/dL
Ketones, ur: NEGATIVE mg/dL
Leukocytes, UA: NEGATIVE
NITRITE: NEGATIVE
PROTEIN: NEGATIVE mg/dL
SPECIFIC GRAVITY, URINE: 1.016 (ref 1.005–1.030)
pH: 5 (ref 5.0–8.0)

## 2015-06-03 LAB — CBC WITH DIFFERENTIAL/PLATELET
BASOS ABS: 0.1 10*3/uL (ref 0–0.1)
BASOS PCT: 1 %
EOS PCT: 3 %
Eosinophils Absolute: 0.2 10*3/uL (ref 0–0.7)
HEMATOCRIT: 40.3 % (ref 40.0–52.0)
Hemoglobin: 13.9 g/dL (ref 13.0–18.0)
Lymphocytes Relative: 34 %
Lymphs Abs: 2.2 10*3/uL (ref 1.0–3.6)
MCH: 28.8 pg (ref 26.0–34.0)
MCHC: 34.4 g/dL (ref 32.0–36.0)
MCV: 83.8 fL (ref 80.0–100.0)
MONOS PCT: 12 %
Monocytes Absolute: 0.8 10*3/uL (ref 0.2–1.0)
Neutro Abs: 3.2 10*3/uL (ref 1.4–6.5)
Neutrophils Relative %: 50 %
Platelets: 196 10*3/uL (ref 150–440)
RBC: 4.81 MIL/uL (ref 4.40–5.90)
RDW: 15.3 % — AB (ref 11.5–14.5)
WBC: 6.4 10*3/uL (ref 3.8–10.6)

## 2015-06-03 LAB — CHLAMYDIA/NGC RT PCR (ARMC ONLY)
CHLAMYDIA TR: NOT DETECTED
N GONORRHOEAE: NOT DETECTED

## 2015-06-03 LAB — LIPASE, BLOOD: Lipase: 56 U/L — ABNORMAL HIGH (ref 22–51)

## 2015-06-03 LAB — TROPONIN I: Troponin I: <0.03 ng/mL (ref <0.031)

## 2015-06-03 MED ORDER — TRAMADOL HCL 50 MG PO TABS: 50.0000 mg | ORAL_TABLET | Freq: Four times a day (QID) | ORAL | Status: DC | PRN

## 2015-06-03 NOTE — ED Notes (Signed)
States diarrhea, chest pain, urinary incontinence, exposure to trich, bone spur on rt humerus

## 2015-06-03 NOTE — Discharge Instructions (Signed)
Acute Pancreatitis Acute pancreatitis is a disease in which the pancreas becomes suddenly inflamed. The pancreas is a large gland located behind your stomach. The pancreas produces enzymes that help digest food. The pancreas also releases the hormones glucagon and insulin that help regulate blood sugar. Damage to the pancreas occurs when the digestive enzymes from the pancreas are activated and begin attacking the pancreas before being released into the intestine. Most acute attacks last a couple of days and can cause serious complications. Some people become dehydrated and develop low blood pressure. In severe cases, bleeding into the pancreas can lead to shock and can be life-threatening. The lungs, heart, and kidneys may fail. CAUSES  Pancreatitis can happen to anyone. In some cases, the cause is unknown. Most cases are caused by:  Alcohol abuse.  Gallstones. Other less common causes are:  Certain medicines.  Exposure to certain chemicals.  Infection.  Damage caused by an accident (trauma).  Abdominal surgery. SYMPTOMS   Pain in the upper abdomen that may radiate to the back.  Tenderness and swelling of the abdomen.  Nausea and vomiting. DIAGNOSIS  Your caregiver will perform a physical exam. Blood and stool tests may be done to confirm the diagnosis. Imaging tests may also be done, such as X-rays, CT scans, or an ultrasound of the abdomen. TREATMENT  Treatment usually requires a stay in the hospital. Treatment may include:  Pain medicine.  Fluid replacement through an intravenous line (IV).  Placing a tube in the stomach to remove stomach contents and control vomiting.  Not eating for 3 or 4 days. This gives your pancreas a rest, because enzymes are not being produced that can cause further damage.  Antibiotic medicines if your condition is caused by an infection.  Surgery of the pancreas or gallbladder. HOME CARE INSTRUCTIONS   Follow the diet advised by your  caregiver. This may involve avoiding alcohol and decreasing the amount of fat in your diet.  Eat smaller, more frequent meals. This reduces the amount of digestive juices the pancreas produces.  Drink enough fluids to keep your urine clear or pale yellow.  Only take over-the-counter or prescription medicines as directed by your caregiver.  Avoid drinking alcohol if it caused your condition.  Do not smoke.  Get plenty of rest.  Check your blood sugar at home as directed by your caregiver.  Keep all follow-up appointments as directed by your caregiver. SEEK MEDICAL CARE IF:   You do not recover as quickly as expected.  You develop new or worsening symptoms.  You have persistent pain, weakness, or nausea.  You recover and then have another episode of pain. SEEK IMMEDIATE MEDICAL CARE IF:   You are unable to eat or keep fluids down.  Your pain becomes severe.  You have a fever or persistent symptoms for more than 2 to 3 days.  You have a fever and your symptoms suddenly get worse.  Your skin or the white part of your eyes turn yellow (jaundice).  You develop vomiting.  You feel dizzy, or you faint.  Your blood sugar is high (over 300 mg/dL). MAKE SURE YOU:   Understand these instructions.  Will watch your condition.  Will get help right away if you are not doing well or get worse. Document Released: 11/10/2005 Document Revised: 05/11/2012 Document Reviewed: 02/19/2012 [facility] Patient Information 2015 [place], [place]. This information is not intended to replace advice given to you by your health care provider. Make sure you discuss any questions you have  with your health care provider.  Diarrhea Diarrhea is frequent loose and watery bowel movements. It can cause you to feel weak and dehydrated. Dehydration can cause you to become tired and thirsty, have a dry mouth, and have decreased urination that often is dark yellow. Diarrhea is a sign of another problem, most  often an infection that will not last long. In most cases, diarrhea typically lasts 2-3 days. However, it can last longer if it is a sign of something more serious. It is important to treat your diarrhea as directed by your caregiver to lessen or prevent future episodes of diarrhea. CAUSES  Some common causes include:  Gastrointestinal infections caused by viruses, bacteria, or parasites.  Food poisoning or food allergies.  Certain medicines, such as antibiotics, chemotherapy, and laxatives.  Artificial sweeteners and fructose.  Digestive disorders. HOME CARE INSTRUCTIONS  Ensure adequate fluid intake (hydration): Have 1 cup (8 oz) of fluid for each diarrhea episode. Avoid fluids that contain simple sugars or sports drinks, fruit juices, whole milk products, and sodas. Your urine should be clear or pale yellow if you are drinking enough fluids. Hydrate with an oral rehydration solution that you can purchase at pharmacies, retail stores, and online. You can prepare an oral rehydration solution at home by mixing the following ingredients together:   - tsp table salt.   tsp baking soda.   tsp salt substitute containing potassium chloride.  1  tablespoons sugar.  1 L (34 oz) of water.  Certain foods and beverages may increase the speed at which food moves through the gastrointestinal (GI) tract. These foods and beverages should be avoided and include:  Caffeinated and alcoholic beverages.  High-fiber foods, such as raw fruits and vegetables, nuts, seeds, and whole grain breads and cereals.  Foods and beverages sweetened with sugar alcohols, such as xylitol, sorbitol, and mannitol.  Some foods may be well tolerated and may help thicken stool including:  Starchy foods, such as rice, toast, pasta, low-sugar cereal, oatmeal, grits, baked potatoes, crackers, and bagels.  Bananas.  Applesauce.  Add probiotic-rich foods to help increase healthy bacteria in the GI tract, such as  yogurt and fermented milk products.  Wash your hands well after each diarrhea episode.  Only take over-the-counter or prescription medicines as directed by your caregiver.  Take a warm bath to relieve any burning or pain from frequent diarrhea episodes. SEEK IMMEDIATE MEDICAL CARE IF:   You are unable to keep fluids down.  You have persistent vomiting.  You have blood in your stool, or your stools are black and tarry.  You do not urinate in 6-8 hours, or there is only a small amount of very dark urine.  You have abdominal pain that increases or localizes.  You have weakness, dizziness, confusion, or light-headedness.  You have a severe headache.  Your diarrhea gets worse or does not get better.  You have a fever or persistent symptoms for more than 2-3 days.  You have a fever and your symptoms suddenly get worse. MAKE SURE YOU:   Understand these instructions.  Will watch your condition.  Will get help right away if you are not doing well or get worse. Document Released: 10/31/2002 Document Revised: 03/27/2014 Document Reviewed: 07/18/2012 [facility] Patient Information 2015 [place], [place]. This information is not intended to replace advice given to you by your health care provider. Make sure you discuss any questions you have with your health care provider.      As we have discussed your  labs are largely within normal limits besides possible mild pancreatitis. Please follow the dietary precautions we spoke about. Please take over-the-counter Imodium as needed for loose stool, as written on the box. Please take your pain medication as needed for pain/discomfort, as prescribed. Follow-up with your doctor tomorrow for further evaluation. Return to the emergency department for any acute or concerning symptoms.
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
## 2015-08-15 ENCOUNTER — Encounter: Payer: Self-pay | Admitting: Emergency Medicine

## 2015-08-15 ENCOUNTER — Emergency Department
Admission: EM | Admit: 2015-08-15 | Discharge: 2015-08-15 | Disposition: A | Discharge status: Home / Self Care | Payer: Medicaid Other | Attending: Emergency Medicine | Admitting: Emergency Medicine

## 2015-08-15 DIAGNOSIS — H5712 Ocular pain, left eye: Secondary | ICD-10-CM | POA: Diagnosis present

## 2015-08-15 DIAGNOSIS — Z720 Tobacco use: Secondary | ICD-10-CM | POA: Insufficient documentation

## 2015-08-15 DIAGNOSIS — H01006 Unspecified blepharitis left eye, unspecified eyelid: Secondary | ICD-10-CM | POA: Diagnosis not present

## 2015-08-15 DIAGNOSIS — H1012 Acute atopic conjunctivitis, left eye: Secondary | ICD-10-CM | POA: Insufficient documentation

## 2015-08-15 MED ORDER — KETOROLAC TROMETHAMINE 0.5 % OP SOLN: 1.0000 [drp] | Freq: Four times a day (QID) | OPHTHALMIC | Status: DC

## 2015-08-15 MED ORDER — GENTAMICIN SULFATE 0.3 % OP SOLN: 2.0000 [drp] | OPHTHALMIC | Status: DC

## 2015-08-15 MED ORDER — TETRACAINE HCL 0.5 % OP SOLN
1.0000 [drp] | Freq: Once | OPHTHALMIC | Status: AC
  Administered 2015-08-15: 1 [drp] via OPHTHALMIC
  Filled 2015-08-15: qty 2

## 2015-08-15 MED ORDER — FLUORESCEIN SODIUM 1 MG OP STRP
1.0000 | ORAL_STRIP | Freq: Once | OPHTHALMIC | Status: DC
  Filled 2015-08-15: qty 1

## 2015-08-15 NOTE — Discharge Instructions (Signed)
Blepharitis Blepharitis is a skin problem that makes your eyelids watery, red, puffy (swollen), crusty, scaly, or painful. It may also make your eyes itch. You may lose eyelashes. HOME CARE  Keep your hands clean.  Use a clean towel each time you dry your eyelids. Do not share towels or makeup with anyone.  Carefully wash your eyelids and eyelashes 2 times a day. Use warm water and baby shampoo or just water.  Wash your face and eyebrows at least once a day.  Hold a folded washcloth under warm water. Squeeze the water out. Put the warm washcloth on your eyes 2 times a day for 10 minutes, or as told by your doctor.  Apply medicated cream as told by your doctor.  Avoid rubbing your eyes.  Avoid wearing makeup until you get better.  Follow up with your doctor as told. GET HELP RIGHT AWAY IF:  Your pain, redness, or puffiness gets worse.  Your pain, redness, or puffiness spreads to other parts of your face.  Your vision changes, or you have pain when looking at lights or moving objects.  You have a fever.  You do not get better after 2 to 4 days. MAKE SURE YOU:  Understand these instructions.  Will watch your condition.  Will get help right away if you are not doing well or get worse. Document Released: 08/19/2008 Document Revised: 02/02/2012 Document Reviewed: 12/18/2010 [facility] Patient Information 2015 [place], [place]. This information is not intended to replace advice given to you by your health care provider. Make sure you discuss any questions you have with your health care provider.   Your eye exam does not show an injury to the eye. There is inflammation and eyelid swelling.  Use the eye drops as directed.  Apply cool compresses to reduce eyelid swelling.  Follow[facility]up with [facility] today or tomorrow if symptoms do not improve.
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
## 2015-08-15 NOTE — ED Notes (Signed)
Pt presents with left eye pain started this am.

## 2016-01-26 ENCOUNTER — Emergency Department
Admission: EM | Admit: 2016-01-26 | Discharge: 2016-01-26 | Disposition: A | Discharge status: Home / Self Care | Payer: Medicaid Other | Attending: Emergency Medicine | Admitting: Emergency Medicine

## 2016-01-26 DIAGNOSIS — M79675 Pain in left toe(s): Secondary | ICD-10-CM | POA: Insufficient documentation

## 2016-01-26 DIAGNOSIS — F17200 Nicotine dependence, unspecified, uncomplicated: Secondary | ICD-10-CM | POA: Diagnosis not present

## 2016-01-26 DIAGNOSIS — M79674 Pain in right toe(s): Secondary | ICD-10-CM | POA: Insufficient documentation

## 2016-01-26 DIAGNOSIS — Z792 Long term (current) use of antibiotics: Secondary | ICD-10-CM | POA: Insufficient documentation

## 2016-01-26 DIAGNOSIS — Z79899 Other long term (current) drug therapy: Secondary | ICD-10-CM | POA: Insufficient documentation

## 2016-01-26 LAB — GLUCOSE, CAPILLARY: Glucose-Capillary: 126 mg/dL — ABNORMAL HIGH (ref 65–99)

## 2016-01-26 NOTE — ED Notes (Signed)
Patient reports bilateral great toe pain and discoloration for "awhile".

## 2016-01-26 NOTE — ED Notes (Signed)
Pt.s blood sugar was 126. Rn notified

## 2016-01-26 NOTE — Discharge Instructions (Signed)

## 2016-01-26 NOTE — ED Notes (Signed)
Pt with pain to bilateral great toenails. Pt states no known injury, denies diabetes. Pt appears in no acute distress. Cms intact to all toes. Slight discoloration of great toenails noted.
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
## 2016-05-01 ENCOUNTER — Emergency Department
Admission: EM | Admit: 2016-05-01 | Discharge: 2016-05-01 | Disposition: A | Discharge status: Home / Self Care | Payer: Medicaid Other | Attending: Emergency Medicine | Admitting: Emergency Medicine

## 2016-05-01 ENCOUNTER — Emergency Department: Payer: Medicaid Other

## 2016-05-01 ENCOUNTER — Encounter: Payer: Self-pay | Admitting: Emergency Medicine

## 2016-05-01 DIAGNOSIS — R0789 Other chest pain: Secondary | ICD-10-CM | POA: Insufficient documentation

## 2016-05-01 DIAGNOSIS — F1219 Cannabis abuse with unspecified cannabis-induced disorder: Secondary | ICD-10-CM | POA: Diagnosis not present

## 2016-05-01 DIAGNOSIS — F17210 Nicotine dependence, cigarettes, uncomplicated: Secondary | ICD-10-CM | POA: Insufficient documentation

## 2016-05-01 DIAGNOSIS — R079 Chest pain, unspecified: Secondary | ICD-10-CM

## 2016-05-01 LAB — BASIC METABOLIC PANEL
ANION GAP: 7 (ref 5–15)
BUN: 15 mg/dL (ref 6–20)
CO2: 23 mmol/L (ref 22–32)
Calcium: 8.7 mg/dL — ABNORMAL LOW (ref 8.9–10.3)
Chloride: 107 mmol/L (ref 101–111)
Creatinine, Ser: 1.34 mg/dL — ABNORMAL HIGH (ref 0.61–1.24)
GFR calc Af Amer: >60 mL/min (ref >60)
GFR calc non Af Amer: >60 mL/min (ref >60)
GLUCOSE: 137 mg/dL — AB (ref 65–99)
POTASSIUM: 3.8 mmol/L (ref 3.5–5.1)
Sodium: 137 mmol/L (ref 135–145)

## 2016-05-01 LAB — CBC WITH DIFFERENTIAL/PLATELET
Basophils Absolute: 0.1 10*3/uL (ref 0–0.1)
Basophils Relative: 1 %
Eosinophils Absolute: 0.1 10*3/uL (ref 0–0.7)
Eosinophils Relative: 2 %
HEMATOCRIT: 38.6 % — AB (ref 40.0–52.0)
Hemoglobin: 13.3 g/dL (ref 13.0–18.0)
LYMPHS ABS: 2.4 10*3/uL (ref 1.0–3.6)
LYMPHS PCT: 34 %
MCH: 28.6 pg (ref 26.0–34.0)
MCHC: 34.4 g/dL (ref 32.0–36.0)
MCV: 83 fL (ref 80.0–100.0)
MONO ABS: 0.7 10*3/uL (ref 0.2–1.0)
MONOS PCT: 10 %
NEUTROS ABS: 3.7 10*3/uL (ref 1.4–6.5)
Neutrophils Relative %: 53 %
Platelets: 189 10*3/uL (ref 150–440)
RBC: 4.65 MIL/uL (ref 4.40–5.90)
RDW: 15 % — AB (ref 11.5–14.5)
WBC: 7 10*3/uL (ref 3.8–10.6)

## 2016-05-01 LAB — TROPONIN I

## 2016-05-01 LAB — FIBRIN DERIVATIVES D-DIMER (ARMC ONLY): FIBRIN DERIVATIVES D-DIMER (ARMC): 625 — AB (ref 0–499)

## 2016-05-01 MED ORDER — IOPAMIDOL (ISOVUE-370) INJECTION 76%
100.0000 mL | Freq: Once | INTRAVENOUS | Status: AC | PRN
  Administered 2016-05-01: 100 mL via INTRAVENOUS

## 2016-05-01 MED ORDER — TRAMADOL HCL 50 MG PO TABS: 50.0000 mg | ORAL_TABLET | Freq: Four times a day (QID) | ORAL | Status: DC | PRN

## 2016-05-01 MED ORDER — IBUPROFEN 600 MG PO TABS
600.0000 mg | ORAL_TABLET | Freq: Once | ORAL | Status: AC
  Administered 2016-05-01: 600 mg via ORAL
  Filled 2016-05-01: qty 1

## 2016-05-01 NOTE — ED Notes (Signed)
Discharge instructions reviewed with patient. Questions fielded by this RN. Patient verbalizes understanding of instructions. Patient discharged home in stable condition per Quigley MD . No acute distress noted at time of discharge.   

## 2016-05-01 NOTE — Discharge Instructions (Signed)
Nonspecific Chest Pain  °Chest pain can be caused by many different conditions. There is always a chance that your pain could be related to something serious, such as a heart attack or a blood clot in your lungs. Chest pain can also be caused by conditions that are not life-threatening. If you have chest pain, it is very important to follow up with your health care provider. °CAUSES  °Chest pain can be caused by: °· Heartburn. °· Pneumonia or bronchitis. °· Anxiety or stress. °· Inflammation around your heart (pericarditis) or lung (pleuritis or pleurisy). °· A blood clot in your lung. °· A collapsed lung (pneumothorax). It can develop suddenly on its own (spontaneous pneumothorax) or from trauma to the chest. °· Shingles infection (varicella-zoster virus). °· Heart attack. °· Damage to the bones, muscles, and cartilage that make up your chest wall. This can include: °¨ Bruised bones due to injury. °¨ Strained muscles or cartilage due to frequent or repeated coughing or overwork. °¨ Fracture to one or more ribs. °¨ Sore cartilage due to inflammation (costochondritis). °RISK FACTORS  °Risk factors for chest pain may include: °· Activities that increase your risk for trauma or injury to your chest. °· Respiratory infections or conditions that cause frequent coughing. °· Medical conditions or overeating that can cause heartburn. °· Heart disease or family history of heart disease. °· Conditions or health behaviors that increase your risk of developing a blood clot. °· Having had chicken pox (varicella zoster). °SIGNS AND SYMPTOMS °Chest pain can feel like: °· Burning or tingling on the surface of your chest or deep in your chest. °· Crushing, pressure, aching, or squeezing pain. °· Dull or sharp pain that is worse when you move, cough, or take a deep breath. °· Pain that is also felt in your back, neck, shoulder, or arm, or pain that spreads to any of these areas. °Your chest pain may come and go, or it may stay  constant. °DIAGNOSIS °Lab tests or other studies may be needed to find the cause of your pain. Your health care provider may have you take a test called an ambulatory ECG (electrocardiogram). An ECG records your heartbeat patterns at the time the test is performed. You may also have other tests, such as: °· Transthoracic echocardiogram (TTE). During echocardiography, sound waves are used to create a picture of all of the heart structures and to look at how blood flows through your heart. °· Transesophageal echocardiogram (TEE). This is a more advanced imaging test that obtains images from inside your body. It allows your health care provider to see your heart in finer detail. °· Cardiac monitoring. This allows your health care provider to monitor your heart rate and rhythm in real time. °· Holter monitor. This is a portable device that records your heartbeat and can help to diagnose abnormal heartbeats. It allows your health care provider to track your heart activity for several days, if needed. °· Stress tests. These can be done through exercise or by taking medicine that makes your heart beat more quickly. °· Blood tests. °· Imaging tests. °TREATMENT  °Your treatment depends on what is causing your chest pain. Treatment may include: °· Medicines. These may include: °¨ Acid blockers for heartburn. °¨ Anti-inflammatory medicine. °¨ Pain medicine for inflammatory conditions. °¨ Antibiotic medicine, if an infection is present. °¨ Medicines to dissolve blood clots. °¨ Medicines to treat coronary artery disease. °· Supportive care for conditions that do not require medicines. This may include: °¨ Resting. °¨ Applying heat   or cold packs to injured areas. °¨ Limiting activities until pain decreases. °HOME CARE INSTRUCTIONS °· If you were prescribed an antibiotic medicine, finish it all even if you start to feel better. °· Avoid any activities that bring on chest pain. °· Do not use any tobacco products, including  cigarettes, chewing tobacco, or electronic cigarettes. If you need help quitting, ask your health care provider. °· Do not drink alcohol. °· Take medicines only as directed by your health care provider. °· Keep all follow-up visits as directed by your health care provider. This is important. This includes any further testing if your chest pain does not go away. °· If heartburn is the cause for your chest pain, you may be told to keep your head raised (elevated) while sleeping. This reduces the chance that acid will go from your stomach into your esophagus. °· Make lifestyle changes as directed by your health care provider. These may include: °¨ Getting regular exercise. Ask your health care provider to suggest some activities that are safe for you. °¨ Eating a heart-healthy diet. A registered dietitian can help you to learn healthy eating options. °¨ Maintaining a healthy weight. °¨ Managing diabetes, if necessary. °¨ Reducing stress. °SEEK MEDICAL CARE IF: °· Your chest pain does not go away after treatment. °· You have a rash with blisters on your chest. °· You have a fever. °SEEK IMMEDIATE MEDICAL CARE IF:  °· Your chest pain is worse. °· You have an increasing cough, or you cough up blood. °· You have severe abdominal pain. °· You have severe weakness. °· You faint. °· You have chills. °· You have sudden, unexplained chest discomfort. °· You have sudden, unexplained discomfort in your arms, back, neck, or jaw. °· You have shortness of breath at any time. °· You suddenly start to sweat, or your skin gets clammy. °· You feel nauseous or you vomit. °· You suddenly feel light-headed or dizzy. °· Your heart begins to beat quickly, or it feels like it is skipping beats. °These symptoms may represent a serious problem that is an emergency. Do not wait to see if the symptoms will go away. Get medical help right away. Call your local emergency services (911 in the [place] Do not drive yourself to the hospital. °  °This  information is not intended to replace advice given to you by your health care provider. Make sure you discuss any questions you have with your health care provider. °  °Document Released: 08/20/2005 Document Revised: 12/01/2014 Document Reviewed: 06/16/2014 °Elsevier Interactive Patient Education ©2016 [employer] ° °Please return immediately if condition worsens. Please contact her primary physician or the physician you were given for referral. If you have any specialist physicians involved in her treatment and plan please also contact them. Thank you for using [facility]. ° °
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
## 2016-05-01 NOTE — ED Notes (Signed)
Patient ambulatory to triage with steady gait, without difficulty or distress noted; pt reports right sided CP since 8am yesterday; st "only hurts when I lay on one side or sit up a certain way"; denies hx of same, denies any accomp symptoms

## 2016-07-09 ENCOUNTER — Emergency Department
Admission: EM | Admit: 2016-07-09 | Discharge: 2016-07-09 | Disposition: A | Discharge status: Home / Self Care | Payer: Medicaid Other | Attending: Emergency Medicine | Admitting: Emergency Medicine

## 2016-07-09 ENCOUNTER — Encounter: Payer: Self-pay | Admitting: Emergency Medicine

## 2016-07-09 ENCOUNTER — Emergency Department: Payer: Medicaid Other

## 2016-07-09 DIAGNOSIS — Y939 Activity, unspecified: Secondary | ICD-10-CM | POA: Diagnosis not present

## 2016-07-09 DIAGNOSIS — W228XXA Striking against or struck by other objects, initial encounter: Secondary | ICD-10-CM | POA: Diagnosis not present

## 2016-07-09 DIAGNOSIS — Y999 Unspecified external cause status: Secondary | ICD-10-CM | POA: Insufficient documentation

## 2016-07-09 DIAGNOSIS — Y9289 Other specified places as the place of occurrence of the external cause: Secondary | ICD-10-CM | POA: Insufficient documentation

## 2016-07-09 DIAGNOSIS — M79621 Pain in right upper arm: Secondary | ICD-10-CM | POA: Diagnosis present

## 2016-07-09 DIAGNOSIS — F17210 Nicotine dependence, cigarettes, uncomplicated: Secondary | ICD-10-CM | POA: Insufficient documentation

## 2016-07-09 DIAGNOSIS — S40021A Contusion of right upper arm, initial encounter: Secondary | ICD-10-CM

## 2016-07-09 MED ORDER — NAPROXEN 500 MG PO TABS: 500.0000 mg | ORAL_TABLET | Freq: Two times a day (BID) | ORAL | 0 refills | Status: DC
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
## 2016-07-09 NOTE — ED Notes (Signed)
States he fell yesterday  And landed on right arm  Having pain from right shoulder to upper arm  No deformity

## 2017-02-28 DIAGNOSIS — J3489 Other specified disorders of nose and nasal sinuses: Secondary | ICD-10-CM | POA: Diagnosis present

## 2017-02-28 DIAGNOSIS — N4821 Abscess of corpus cavernosum and penis: Secondary | ICD-10-CM | POA: Diagnosis not present

## 2017-02-28 DIAGNOSIS — F17210 Nicotine dependence, cigarettes, uncomplicated: Secondary | ICD-10-CM | POA: Diagnosis not present

## 2017-02-28 DIAGNOSIS — J340 Abscess, furuncle and carbuncle of nose: Secondary | ICD-10-CM | POA: Insufficient documentation

## 2017-03-01 ENCOUNTER — Encounter: Payer: Self-pay | Admitting: Emergency Medicine

## 2017-03-01 ENCOUNTER — Emergency Department
Admission: EM | Admit: 2017-03-01 | Discharge: 2017-03-01 | Disposition: A | Discharge status: Home / Self Care | Payer: Medicaid Other | Attending: Emergency Medicine | Admitting: Emergency Medicine

## 2017-03-01 DIAGNOSIS — J340 Abscess, furuncle and carbuncle of nose: Secondary | ICD-10-CM

## 2017-03-01 DIAGNOSIS — N4821 Abscess of corpus cavernosum and penis: Secondary | ICD-10-CM

## 2017-03-01 HISTORY — DX: Schizophrenia, unspecified: F20.9

## 2017-03-01 MED ORDER — AZITHROMYCIN 500 MG PO TABS
1000.0000 mg | ORAL_TABLET | ORAL | Status: AC
  Administered 2017-03-01: 1000 mg via ORAL
  Filled 2017-03-01: qty 2
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
## 2017-03-01 NOTE — Discharge Instructions (Signed)
As we discussed, it does not appear that the lesion on your penis is due to a sexually transmitted infection (STI).  Both the lesions in your nose and the lesion on your penis appear to be improving and are likely the result of regular skin bacteria.  Just in case, we gave you a one-time treatment dose of azithromycin 1 gram by mouth which treats a condition known as H. ducreyi (chancroid), so if your sexual partner(s) begins to develop any symptoms, she should be tested as well.  We also recommend that you smear a small amount of bacitracin (antibacterial ointment) into each nostril twice daily until the nasal lesions resolve.  Please take the full course of prescribed antibiotics and follow up with the providers as needed and as listed in this documentation.

## 2017-03-01 NOTE — ED Notes (Signed)
Pt reports nodule to shaft of penis, not visualized att

## 2017-03-01 NOTE — ED Triage Notes (Signed)
Pt presents with 2 complaints-1) pt says he has had scabbed areas in both nostrils for several days; both nostrils cleared up and now he just has scabbed area to top of right nostril; c/o pain to area; denies any sinus congestion; (2) pt also c/o a knot or "abscess" on the shaft of his penis that he noticed 10 days ago; was seen for same at [place], blood drawn but never called with any results; pt says area has opened and drained and now there is a "crater"; c/o pain to shaft of penis; denies fever; denies penile discharge; denies unprotected intercourse; pt in no acute distress;

## 2017-03-01 NOTE — ED Notes (Signed)
Pt found in room att 

## 2017-08-15 ENCOUNTER — Encounter: Payer: Self-pay | Admitting: Emergency Medicine

## 2017-08-15 ENCOUNTER — Emergency Department
Admission: EM | Admit: 2017-08-15 | Discharge: 2017-08-17 | Disposition: A | Discharge status: Other Acute Inpatient Hospital | Payer: Medicaid Other | Attending: Emergency Medicine | Admitting: Emergency Medicine

## 2017-08-15 DIAGNOSIS — R443 Hallucinations, unspecified: Secondary | ICD-10-CM

## 2017-08-15 DIAGNOSIS — F17210 Nicotine dependence, cigarettes, uncomplicated: Secondary | ICD-10-CM | POA: Diagnosis not present

## 2017-08-15 DIAGNOSIS — F1910 Other psychoactive substance abuse, uncomplicated: Secondary | ICD-10-CM | POA: Insufficient documentation

## 2017-08-15 DIAGNOSIS — Z9114 Patient's other noncompliance with medication regimen: Secondary | ICD-10-CM | POA: Diagnosis not present

## 2017-08-15 DIAGNOSIS — F333 Major depressive disorder, recurrent, severe with psychotic symptoms: Secondary | ICD-10-CM | POA: Diagnosis not present

## 2017-08-15 DIAGNOSIS — R440 Auditory hallucinations: Secondary | ICD-10-CM | POA: Diagnosis present

## 2017-08-15 HISTORY — DX: Schizoaffective disorder, bipolar type: F25.0

## 2017-08-15 LAB — URINE DRUG SCREEN, QUALITATIVE (ARMC ONLY)
Amphetamines, Ur Screen: NOT DETECTED
BARBITURATES, UR SCREEN: NOT DETECTED
Benzodiazepine, Ur Scrn: NOT DETECTED
CANNABINOID 50 NG, UR ~~LOC~~: POSITIVE — AB
COCAINE METABOLITE, UR ~~LOC~~: POSITIVE — AB
MDMA (Ecstasy)Ur Screen: NOT DETECTED
Methadone Scn, Ur: NOT DETECTED
OPIATE, UR SCREEN: NOT DETECTED
PHENCYCLIDINE (PCP) UR S: NOT DETECTED
TRICYCLIC, UR SCREEN: NOT DETECTED

## 2017-08-15 LAB — COMPREHENSIVE METABOLIC PANEL
ALT: 20 U/L (ref 17–63)
ANION GAP: 10 (ref 5–15)
AST: 27 U/L (ref 15–41)
Albumin: 4.2 g/dL (ref 3.5–5.0)
Alkaline Phosphatase: 57 U/L (ref 38–126)
BILIRUBIN TOTAL: 0.5 mg/dL (ref 0.3–1.2)
BUN: 8 mg/dL (ref 6–20)
CO2: 22 mmol/L (ref 22–32)
Calcium: 9 mg/dL (ref 8.9–10.3)
Chloride: 106 mmol/L (ref 101–111)
Creatinine, Ser: 1.09 mg/dL (ref 0.61–1.24)
Glucose, Bld: 159 mg/dL — ABNORMAL HIGH (ref 65–99)
POTASSIUM: 3.2 mmol/L — AB (ref 3.5–5.1)
Sodium: 138 mmol/L (ref 135–145)
TOTAL PROTEIN: 7.4 g/dL (ref 6.5–8.1)

## 2017-08-15 LAB — ETHANOL

## 2017-08-15 LAB — CBC
HEMATOCRIT: 39.5 % — AB (ref 40.0–52.0)
HEMOGLOBIN: 13.9 g/dL (ref 13.0–18.0)
MCH: 29.2 pg (ref 26.0–34.0)
MCHC: 35.2 g/dL (ref 32.0–36.0)
MCV: 83.1 fL (ref 80.0–100.0)
Platelets: 185 10*3/uL (ref 150–440)
RBC: 4.74 MIL/uL (ref 4.40–5.90)
RDW: 14.6 % — ABNORMAL HIGH (ref 11.5–14.5)
WBC: 9.6 10*3/uL (ref 3.8–10.6)

## 2017-08-15 LAB — SALICYLATE LEVEL

## 2017-08-15 LAB — ACETAMINOPHEN LEVEL: Acetaminophen (Tylenol), Serum: <10 ug/mL — ABNORMAL LOW (ref 10–30)

## 2017-08-15 MED ORDER — PALIPERIDONE ER 3 MG PO TB24
6.0000 mg | ORAL_TABLET | Freq: Every day | ORAL | Status: DC
  Administered 2017-08-15 – 2017-08-17 (×3): 6 mg via ORAL
  Filled 2017-08-15 (×2): qty 2
  Filled 2017-08-15: qty 1

## 2017-08-15 MED ORDER — LORAZEPAM 2 MG PO TABS: 2.0000 mg | ORAL_TABLET | Freq: Four times a day (QID) | ORAL | Status: DC | PRN

## 2017-08-15 MED ORDER — HALOPERIDOL 5 MG PO TABS: 5.0000 mg | ORAL_TABLET | Freq: Three times a day (TID) | ORAL | Status: DC | PRN

## 2017-08-15 MED ORDER — HALOPERIDOL LACTATE 5 MG/ML IJ SOLN: 5.0000 mg | Freq: Three times a day (TID) | INTRAMUSCULAR | Status: DC | PRN

## 2017-08-15 NOTE — ED Provider Notes (Signed)
psychiatry consult obtained and reviewed. Recommends involuntary commitment and multiple medications. I have ordered all these and initiated IVC. Awaiting inpatient psychiatry placement. Patient remains medically stable.   Sharman Cheek, MD 08/15/17 [phone]

## 2017-08-15 NOTE — ED Triage Notes (Addendum)
Pt very tearful in triage, difficult to understand because crying.  Pt has had some thoughts of SI but denies then today.  Has tried to hurt self in past. Denies HI.  Keeps talking about wanting to be around for his son.  Admits to hearing voices and the voices tell him to hurt himself.  Poor eye contact.  Supposed to get invega IM monthly but has not had since February.  Uses crack cocaine.

## 2017-08-15 NOTE — ED Notes (Signed)
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
## 2017-08-15 NOTE — ED Notes (Signed)
Report received from [facility]. Patient to be moved to [facility] 2 once vitals obtained.

## 2017-08-15 NOTE — ED Notes (Signed)
Called SOC for consult   [phone] 

## 2017-08-15 NOTE — ED Notes (Signed)
Pt. To BHU from ED ambulatory without difficulty, to room  BHU2. Report from [facility]. Pt. Is alert and oriented, warm and dry in no distress. Pt. Denies SI, HI, and AVH. Pt. Calm and cooperative. Pt. Made aware of security cameras and Q15 minute rounds. Pt. Encouraged to let Nursing staff know of any concerns or needs.

## 2017-08-15 NOTE — ED Notes (Signed)

## 2017-08-16 MED ORDER — POTASSIUM CHLORIDE CRYS ER 20 MEQ PO TBCR
40.0000 meq | EXTENDED_RELEASE_TABLET | Freq: Once | ORAL | Status: AC
  Administered 2017-08-16: 40 meq via ORAL
  Filled 2017-08-16: qty 2

## 2017-08-16 NOTE — ED Provider Notes (Signed)
-----------------------------------------   7:16 AM on 08/16/2017 -----------------------------------------   Blood pressure 127/73, pulse 80, temperature 98.1 F (36.7 C), temperature source Oral, resp. rate 18, height  (1.905 m), weight 115.7 kg (255 lb), SpO2 98 %.  The patient had no acute events since last update.  Calm and cooperative at this time.  Disposition is pending Psychiatry/Behavioral Medicine team recommendations.     Jeanmarie Plant, MD 08/16/17 [phone]

## 2017-08-16 NOTE — ED Notes (Signed)
IVC 

## 2017-08-16 NOTE — ED Notes (Signed)
Called [facility] about possible Potassium level and wanting a recheck. [facility] states no, it is fine

## 2017-08-16 NOTE — ED Notes (Signed)
Report was received from [name], RN; Pt. Verbalizes  complaints of having Depression; with distress of job; finances; and relationship; verbalizes having S.I.; with denying having Hi. Continue to monitor with 15 min. Monitoring.

## 2017-08-16 NOTE — BH Assessment (Signed)
Referral information for Psychiatric Hospitalization faxed to;      [facility] ([phone] or [phone]   [facility] ([phone]),    [facility] ([phone]),     [facility] ([phone]),    

## 2017-08-16 NOTE — ED Notes (Signed)
Called [facility] for consult   [phone]

## 2017-08-16 NOTE — BH Assessment (Signed)
Spoke to [place] at [facility] will be able to accept pt tomorrow (Monday, August 17, 2017) after 7:00AM. Carron Brazen states pt's potassium level is too low and they request something is done to have it raised prior to admission. Shaunte's number is [phone].  Physician: Danton Sewer Room:  [facility] Unit, Room Unknown Phone: [phone]

## 2017-08-16 NOTE — ED Notes (Signed)
Patient sitting on bed in room. Patient alert and oriented. Patient denies SI/HI. Patient denies A/V/H. Patient states he has been off his medication and using cocaine. Patient desires to get back on medication and in a program. Support and encouragement provided. Q 15 minute checks in progress and maintained. Patient remains safe on unit.

## 2017-08-16 NOTE — ED Notes (Signed)
Patient is resting on bed  with eyes open. No distress noted and monitoring continues.

## 2017-08-16 NOTE — BH Assessment (Signed)
Requested EDP put in an order to provide pt with medication to raise his potassium, as requested by Yvetta Coder prior to his admission tomorrow (Monday, August 17, 2017 after 7AM).

## 2017-08-16 NOTE — ED Notes (Signed)
Patient asleep in bed. Respirations even and non labored. No distress noted. Monitoring continues.

## 2017-08-16 NOTE — ED Notes (Signed)

## 2017-08-17 NOTE — ED Provider Notes (Signed)
-----------------------------------------   7:31 AM on 08/17/2017 -----------------------------------------   Blood pressure 118/83, pulse 81, temperature 98.2 F (36.8 C), temperature source Oral, resp. rate 18, height  (1.905 m), weight 115.7 kg (255 lb), SpO2 97 %.  The patient had no acute events since last update.  Calm and cooperative at this time.  Disposition is pending Psychiatry/Behavioral Medicine team recommendations.     Myrna Blazer, MD 08/17/17 [phone]

## 2017-08-17 NOTE — ED Notes (Signed)
Patient is alert and oriented x 4.  Presents with calm affect and pleasant mood.  Denies suicidal thoughts, auditory and visual hallucinations.  Routine safety checks maintained every 15 minutes.  Patient transferred to [facility].  Personal belonging transported with patient.

## 2017-08-17 NOTE — ED Notes (Signed)
EMTALA completed by primary RN, reviewed by this Clinical research associate

## 2017-12-22 ENCOUNTER — Encounter: Payer: Self-pay | Admitting: Emergency Medicine

## 2017-12-22 ENCOUNTER — Other Ambulatory Visit: Payer: Self-pay

## 2017-12-22 ENCOUNTER — Emergency Department: Payer: Medicaid Other

## 2017-12-22 ENCOUNTER — Emergency Department
Admission: EM | Admit: 2017-12-22 | Discharge: 2017-12-22 | Disposition: A | Discharge status: Home / Self Care | Payer: Medicaid Other | Attending: Emergency Medicine | Admitting: Emergency Medicine

## 2017-12-22 DIAGNOSIS — S40021A Contusion of right upper arm, initial encounter: Secondary | ICD-10-CM | POA: Insufficient documentation

## 2017-12-22 DIAGNOSIS — W19XXXA Unspecified fall, initial encounter: Secondary | ICD-10-CM

## 2017-12-22 DIAGNOSIS — W000XXA Fall on same level due to ice and snow, initial encounter: Secondary | ICD-10-CM | POA: Insufficient documentation

## 2017-12-22 DIAGNOSIS — Y998 Other external cause status: Secondary | ICD-10-CM | POA: Insufficient documentation

## 2017-12-22 DIAGNOSIS — Y929 Unspecified place or not applicable: Secondary | ICD-10-CM | POA: Insufficient documentation

## 2017-12-22 DIAGNOSIS — Z79899 Other long term (current) drug therapy: Secondary | ICD-10-CM | POA: Diagnosis not present

## 2017-12-22 DIAGNOSIS — Y9389 Activity, other specified: Secondary | ICD-10-CM | POA: Diagnosis not present

## 2017-12-22 DIAGNOSIS — F17210 Nicotine dependence, cigarettes, uncomplicated: Secondary | ICD-10-CM | POA: Diagnosis not present

## 2017-12-22 DIAGNOSIS — S4991XA Unspecified injury of right shoulder and upper arm, initial encounter: Secondary | ICD-10-CM | POA: Diagnosis present

## 2017-12-22 MED ORDER — IBUPROFEN 600 MG PO TABS: 600.0000 mg | ORAL_TABLET | Freq: Three times a day (TID) | ORAL | 0 refills | Status: DC | PRN

## 2017-12-22 NOTE — ED Notes (Signed)
See triage note  States he fell from porch  Landed on right arm  Having pain from right elbow into upper arm  Increased pain with movement  Good pulses

## 2017-12-22 NOTE — Discharge Instructions (Signed)
Ice and elevate as needed for pain and swelling.  Wear sling no more than 2 days.  Begin taking ibuprofen 600 mg 3 times daily with food.  Follow-up with your primary care provider at [facility] if any continued problems.

## 2017-12-22 NOTE — ED Triage Notes (Signed)
Fell on ice this am and injured his right arm
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
## 2018-04-28 ENCOUNTER — Emergency Department
Admission: EM | Admit: 2018-04-28 | Discharge: 2018-04-28 | Disposition: A | Discharge status: Home / Self Care | Payer: Medicaid Other | Attending: Student in an Organized Health Care Education/Training Program | Admitting: Student in an Organized Health Care Education/Training Program

## 2018-04-28 ENCOUNTER — Other Ambulatory Visit: Payer: Self-pay

## 2018-04-28 ENCOUNTER — Encounter: Payer: Self-pay | Admitting: Emergency Medicine

## 2018-04-28 DIAGNOSIS — Z7984 Long term (current) use of oral hypoglycemic drugs: Secondary | ICD-10-CM | POA: Insufficient documentation

## 2018-04-28 DIAGNOSIS — F17210 Nicotine dependence, cigarettes, uncomplicated: Secondary | ICD-10-CM | POA: Insufficient documentation

## 2018-04-28 DIAGNOSIS — E119 Type 2 diabetes mellitus without complications: Secondary | ICD-10-CM | POA: Diagnosis not present

## 2018-04-28 DIAGNOSIS — Z79899 Other long term (current) drug therapy: Secondary | ICD-10-CM | POA: Diagnosis not present

## 2018-04-28 DIAGNOSIS — M542 Cervicalgia: Secondary | ICD-10-CM | POA: Insufficient documentation

## 2018-04-28 DIAGNOSIS — G8929 Other chronic pain: Secondary | ICD-10-CM | POA: Diagnosis not present

## 2018-04-28 HISTORY — DX: Type 2 diabetes mellitus without complications: E11.9

## 2018-04-28 MED ORDER — KETOROLAC TROMETHAMINE 30 MG/ML IJ SOLN
30.0000 mg | Freq: Once | INTRAMUSCULAR | Status: AC
  Administered 2018-04-28: 30 mg via INTRAMUSCULAR
  Filled 2018-04-28: qty 1

## 2018-04-28 MED ORDER — NAPROXEN 500 MG PO TABS: 500.0000 mg | ORAL_TABLET | Freq: Two times a day (BID) | ORAL | 0 refills | Status: DC
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
## 2018-04-28 NOTE — ED Triage Notes (Signed)
Pt in via POV with complaints of worsening posterior neck pain x approximately two months.  Pt ambulatory to triage, vitals WDL, NAD noted at this time.
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
## 2018-04-28 NOTE — Discharge Instructions (Signed)
He will need to follow-up with your [facility] care provider at [facility] for a follow-up appointment and continued medication.  You may use ice or heat to the neck and back as needed for discomfort.  Anti-inflammatories to be taken daily with food.

## 2018-05-16 ENCOUNTER — Encounter: Payer: Self-pay | Admitting: Emergency Medicine

## 2018-05-16 ENCOUNTER — Emergency Department
Admission: EM | Admit: 2018-05-16 | Discharge: 2018-05-16 | Disposition: A | Discharge status: Home / Self Care | Payer: Medicaid Other | Attending: Emergency Medicine | Admitting: Emergency Medicine

## 2018-05-16 ENCOUNTER — Other Ambulatory Visit: Payer: Self-pay

## 2018-05-16 DIAGNOSIS — E119 Type 2 diabetes mellitus without complications: Secondary | ICD-10-CM | POA: Insufficient documentation

## 2018-05-16 DIAGNOSIS — F17210 Nicotine dependence, cigarettes, uncomplicated: Secondary | ICD-10-CM | POA: Insufficient documentation

## 2018-05-16 DIAGNOSIS — H10021 Other mucopurulent conjunctivitis, right eye: Secondary | ICD-10-CM | POA: Diagnosis not present

## 2018-05-16 DIAGNOSIS — H5789 Other specified disorders of eye and adnexa: Secondary | ICD-10-CM | POA: Diagnosis present

## 2018-05-16 DIAGNOSIS — Z79899 Other long term (current) drug therapy: Secondary | ICD-10-CM | POA: Diagnosis not present

## 2018-05-16 DIAGNOSIS — H1031 Unspecified acute conjunctivitis, right eye: Secondary | ICD-10-CM

## 2018-05-16 MED ORDER — TETRACAINE HCL 0.5 % OP SOLN
2.0000 [drp] | Freq: Once | OPHTHALMIC | Status: DC
  Filled 2018-05-16: qty 4

## 2018-05-16 MED ORDER — FLUORESCEIN SODIUM 1 MG OP STRP
1.0000 | ORAL_STRIP | Freq: Once | OPHTHALMIC | Status: DC
  Filled 2018-05-16: qty 1

## 2018-05-16 MED ORDER — MOXIFLOXACIN HCL 0.5 % OP SOLN: 1.0000 [drp] | Freq: Three times a day (TID) | OPHTHALMIC | 0 refills | Status: AC
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
## 2018-05-16 NOTE — ED Triage Notes (Signed)
Pt comes into the ED via POV c/o right eye drainage.  Patient states it felt as though something got in it yesterday and he tried to flush it with water, but it is still draining, itching, and has a red conjunctiva.  Patient in NAD at this time with even and unlabored respirations.

## 2018-05-16 NOTE — Discharge Instructions (Addendum)
You are being treated for a bacterial infection of the eye. Use the eye drops as directed. Keep your hands clean before and after administering eye drops. Follow-up with [facility] as needed.

## 2019-02-02 ENCOUNTER — Emergency Department
Admission: EM | Admit: 2019-02-02 | Discharge: 2019-02-03 | Disposition: A | Discharge status: Home / Self Care | Payer: Medicaid Other | Attending: Emergency Medicine | Admitting: Emergency Medicine

## 2019-02-02 ENCOUNTER — Encounter: Payer: Self-pay | Admitting: Emergency Medicine

## 2019-02-02 ENCOUNTER — Other Ambulatory Visit: Payer: Self-pay

## 2019-02-02 DIAGNOSIS — F17210 Nicotine dependence, cigarettes, uncomplicated: Secondary | ICD-10-CM | POA: Insufficient documentation

## 2019-02-02 DIAGNOSIS — K029 Dental caries, unspecified: Secondary | ICD-10-CM | POA: Insufficient documentation

## 2019-02-02 DIAGNOSIS — K0889 Other specified disorders of teeth and supporting structures: Secondary | ICD-10-CM

## 2019-02-02 NOTE — ED Triage Notes (Signed)
Patient states he had tooth pain on the right side that started 2 days ago and moved into jaw and states he feels like it is a little swollen.

## 2019-02-03 MED ORDER — AMOXICILLIN-POT CLAVULANATE 875-125 MG PO TABS: 1.0000 | ORAL_TABLET | Freq: Two times a day (BID) | ORAL | 0 refills | Status: AC

## 2019-02-03 MED ORDER — LIDOCAINE VISCOUS HCL 2 % MT SOLN
15.0000 mL | Freq: Once | OROMUCOSAL | Status: AC
  Administered 2019-02-03: 15 mL via OROMUCOSAL
  Filled 2019-02-03: qty 15

## 2019-02-03 MED ORDER — AMOXICILLIN-POT CLAVULANATE 875-125 MG PO TABS
1.0000 | ORAL_TABLET | Freq: Once | ORAL | Status: AC
  Administered 2019-02-03: 1 via ORAL
  Filled 2019-02-03: qty 1

## 2019-02-03 MED ORDER — KETOROLAC TROMETHAMINE 10 MG PO TABS: 10.0000 mg | ORAL_TABLET | Freq: Four times a day (QID) | ORAL | 0 refills | Status: DC | PRN

## 2019-02-03 MED ORDER — KETOROLAC TROMETHAMINE 10 MG PO TABS
10.0000 mg | ORAL_TABLET | Freq: Once | ORAL | Status: AC
  Administered 2019-02-03: 10 mg via ORAL
  Filled 2019-02-03: qty 1
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
## 2019-02-03 NOTE — ED Notes (Signed)
MD saw patient in triage 3 and discharged from there.

## 2019-07-06 ENCOUNTER — Emergency Department: Payer: Medicaid Other

## 2019-07-06 ENCOUNTER — Other Ambulatory Visit: Payer: Self-pay

## 2019-07-06 ENCOUNTER — Emergency Department
Admission: EM | Admit: 2019-07-06 | Discharge: 2019-07-06 | Disposition: A | Discharge status: Home / Self Care | Payer: Medicaid Other | Attending: Emergency Medicine | Admitting: Emergency Medicine

## 2019-07-06 DIAGNOSIS — R0789 Other chest pain: Secondary | ICD-10-CM | POA: Diagnosis not present

## 2019-07-06 DIAGNOSIS — R079 Chest pain, unspecified: Secondary | ICD-10-CM | POA: Diagnosis present

## 2019-07-06 DIAGNOSIS — Z7984 Long term (current) use of oral hypoglycemic drugs: Secondary | ICD-10-CM | POA: Diagnosis not present

## 2019-07-06 DIAGNOSIS — E119 Type 2 diabetes mellitus without complications: Secondary | ICD-10-CM | POA: Diagnosis not present

## 2019-07-06 DIAGNOSIS — F17210 Nicotine dependence, cigarettes, uncomplicated: Secondary | ICD-10-CM | POA: Insufficient documentation

## 2019-07-06 LAB — CBC
HCT: 40.5 % (ref 39.0–52.0)
Hemoglobin: 14.3 g/dL (ref 13.0–17.0)
MCH: 29.1 pg (ref 26.0–34.0)
MCHC: 35.3 g/dL (ref 30.0–36.0)
MCV: 82.5 fL (ref 80.0–100.0)
Platelets: 190 10*3/uL (ref 150–400)
RBC: 4.91 MIL/uL (ref 4.22–5.81)
RDW: 13.7 % (ref 11.5–15.5)
WBC: 9.7 10*3/uL (ref 4.0–10.5)
nRBC: 0 % (ref 0.0–0.2)

## 2019-07-06 LAB — BASIC METABOLIC PANEL
Anion gap: 7 (ref 5–15)
BUN: 14 mg/dL (ref 6–20)
CO2: 23 mmol/L (ref 22–32)
Calcium: 8.9 mg/dL (ref 8.9–10.3)
Chloride: 108 mmol/L (ref 98–111)
Creatinine, Ser: 1.17 mg/dL (ref 0.61–1.24)
GFR calc Af Amer: >60 mL/min (ref >60)
GFR calc non Af Amer: >60 mL/min (ref >60)
Glucose, Bld: 124 mg/dL — ABNORMAL HIGH (ref 70–99)
Potassium: 3.5 mmol/L (ref 3.5–5.1)
Sodium: 138 mmol/L (ref 135–145)

## 2019-07-06 LAB — HEPATIC FUNCTION PANEL
ALT: 19 U/L (ref 0–44)
AST: 22 U/L (ref 15–41)
Albumin: 3.9 g/dL (ref 3.5–5.0)
Alkaline Phosphatase: 51 U/L (ref 38–126)
Bilirubin, Direct: <0.1 mg/dL (ref 0.0–0.2)
Total Bilirubin: 0.4 mg/dL (ref 0.3–1.2)
Total Protein: 7 g/dL (ref 6.5–8.1)

## 2019-07-06 LAB — LIPASE, BLOOD: Lipase: 35 U/L (ref 11–51)

## 2019-07-06 LAB — TROPONIN I (HIGH SENSITIVITY)
Troponin I (High Sensitivity): 2 ng/L (ref <18)
Troponin I (High Sensitivity): 3 ng/L (ref <18)

## 2019-07-06 MED ORDER — SODIUM CHLORIDE 0.9% FLUSH: 3.0000 mL | Freq: Once | INTRAVENOUS | Status: DC

## 2019-07-06 MED ORDER — KETOROLAC TROMETHAMINE 30 MG/ML IJ SOLN
30.0000 mg | Freq: Once | INTRAMUSCULAR | Status: AC
  Administered 2019-07-06: 30 mg via INTRAVENOUS
  Filled 2019-07-06: qty 1

## 2019-07-06 MED ORDER — FAMOTIDINE IN NACL 20-0.9 MG/50ML-% IV SOLN
20.0000 mg | Freq: Once | INTRAVENOUS | Status: AC
  Administered 2019-07-06: 20 mg via INTRAVENOUS
  Filled 2019-07-06: qty 50

## 2019-07-06 NOTE — ED Notes (Signed)
Documentation by this RN done in error at 1450

## 2019-07-06 NOTE — ED Notes (Signed)
Pt given turkey sandwich tray and water 

## 2019-07-06 NOTE — ED Triage Notes (Signed)
Pt comes via OV from home with c/o central chest pain that started this am. Pt states he woke up and it started. Pt also states dizziness, sweating and SOB.  Pt denies any hx of this. Pt state he does smoke.  Pt states 8/10 chest pain. Pt appears uncomfortable in chair.

## 2019-07-06 NOTE — ED Notes (Signed)
Pt inquiring about wait time; this RN explained that he is the longest wait and should be next to receive a room given nothing more emergent walk in.  Pt expresses verbal understanding at this time.

## 2019-07-06 NOTE — Discharge Instructions (Addendum)
Return to the ER for new, worsening, or persistent severe chest pain or any other new or worsening symptoms that concern you.  We would recommend that you take an antacid medication such as ranitidine which is available over-the-counter.  Follow-up with your regular doctor.
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
## 2019-07-06 NOTE — ED Notes (Addendum)
Pt states central CP that began when he woke up today. Has never had this pain before. Wasn't doing anything abnormal when pain began. Doesn't have gallbladder. States a year ago had "borderline pancreatitis." states has been drinking more the past few months. A&O, ambulatory. No distress noted. Hx DM, takes metformin.

## 2019-07-25 ENCOUNTER — Other Ambulatory Visit: Payer: Self-pay

## 2019-07-25 ENCOUNTER — Emergency Department: Payer: No Typology Code available for payment source

## 2019-07-25 ENCOUNTER — Emergency Department
Admission: EM | Admit: 2019-07-25 | Discharge: 2019-07-26 | Disposition: A | Discharge status: Home / Self Care | Payer: No Typology Code available for payment source | Attending: Emergency Medicine | Admitting: Emergency Medicine

## 2019-07-25 DIAGNOSIS — S3982XA Other specified injuries of lower back, initial encounter: Secondary | ICD-10-CM | POA: Diagnosis present

## 2019-07-25 DIAGNOSIS — Y939 Activity, unspecified: Secondary | ICD-10-CM | POA: Insufficient documentation

## 2019-07-25 DIAGNOSIS — Z7984 Long term (current) use of oral hypoglycemic drugs: Secondary | ICD-10-CM | POA: Diagnosis not present

## 2019-07-25 DIAGNOSIS — E119 Type 2 diabetes mellitus without complications: Secondary | ICD-10-CM | POA: Insufficient documentation

## 2019-07-25 DIAGNOSIS — S8001XA Contusion of right knee, initial encounter: Secondary | ICD-10-CM

## 2019-07-25 DIAGNOSIS — F17210 Nicotine dependence, cigarettes, uncomplicated: Secondary | ICD-10-CM | POA: Insufficient documentation

## 2019-07-25 DIAGNOSIS — Y998 Other external cause status: Secondary | ICD-10-CM | POA: Insufficient documentation

## 2019-07-25 DIAGNOSIS — S39012A Strain of muscle, fascia and tendon of lower back, initial encounter: Secondary | ICD-10-CM | POA: Diagnosis not present

## 2019-07-25 DIAGNOSIS — S8002XA Contusion of left knee, initial encounter: Secondary | ICD-10-CM | POA: Diagnosis not present

## 2019-07-25 DIAGNOSIS — Y92410 Unspecified street and highway as the place of occurrence of the external cause: Secondary | ICD-10-CM | POA: Insufficient documentation

## 2019-07-25 DIAGNOSIS — Z79899 Other long term (current) drug therapy: Secondary | ICD-10-CM | POA: Diagnosis not present

## 2019-07-25 MED ORDER — MELOXICAM 15 MG PO TABS: 15.0000 mg | ORAL_TABLET | Freq: Every day | ORAL | 0 refills | Status: DC

## 2019-07-25 MED ORDER — METHOCARBAMOL 500 MG PO TABS: 500.0000 mg | ORAL_TABLET | Freq: Four times a day (QID) | ORAL | 0 refills | Status: DC
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
## 2019-10-18 ENCOUNTER — Emergency Department: Payer: Medicaid Other

## 2019-10-18 ENCOUNTER — Emergency Department
Admission: EM | Admit: 2019-10-18 | Discharge: 2019-10-18 | Disposition: A | Discharge status: Home / Self Care | Payer: Medicaid Other | Attending: Emergency Medicine | Admitting: Emergency Medicine

## 2019-10-18 ENCOUNTER — Other Ambulatory Visit: Payer: Self-pay

## 2019-10-18 DIAGNOSIS — S99921A Unspecified injury of right foot, initial encounter: Secondary | ICD-10-CM | POA: Diagnosis present

## 2019-10-18 DIAGNOSIS — S91331A Puncture wound without foreign body, right foot, initial encounter: Secondary | ICD-10-CM | POA: Diagnosis not present

## 2019-10-18 DIAGNOSIS — Z7984 Long term (current) use of oral hypoglycemic drugs: Secondary | ICD-10-CM | POA: Insufficient documentation

## 2019-10-18 DIAGNOSIS — Y999 Unspecified external cause status: Secondary | ICD-10-CM | POA: Insufficient documentation

## 2019-10-18 DIAGNOSIS — W450XXA Nail entering through skin, initial encounter: Secondary | ICD-10-CM | POA: Insufficient documentation

## 2019-10-18 DIAGNOSIS — Y939 Activity, unspecified: Secondary | ICD-10-CM | POA: Diagnosis not present

## 2019-10-18 DIAGNOSIS — Y929 Unspecified place or not applicable: Secondary | ICD-10-CM | POA: Diagnosis not present

## 2019-10-18 DIAGNOSIS — E119 Type 2 diabetes mellitus without complications: Secondary | ICD-10-CM | POA: Diagnosis not present

## 2019-10-18 DIAGNOSIS — F17210 Nicotine dependence, cigarettes, uncomplicated: Secondary | ICD-10-CM | POA: Insufficient documentation

## 2019-10-18 MED ORDER — LEVOFLOXACIN 500 MG PO TABS: 500.0000 mg | ORAL_TABLET | Freq: Every day | ORAL | 0 refills | Status: AC

## 2019-10-18 NOTE — ED Triage Notes (Signed)
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 Pt does not want to file workers comp. Awoke this AM with pain to left foot, pt diabetic and wants evaluation. Last tetanus possibly in 2013. Pt alert and oriented X4, cooperative, RR even and unlabored, color WNL. Pt in NAD.
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
## 2019-10-18 NOTE — ED Notes (Signed)
See triage note  Presents with pain and swelling to left foot  States he stepped down on a roofing nail  yesterday

## 2019-10-18 NOTE — Discharge Instructions (Addendum)
Follow-up with your regular doctor if not better in 3 days.  Return emergency department if worsening.  Take your antibiotic as prescribed.  Wash the area with soap and water daily.

## 2020-03-14 ENCOUNTER — Other Ambulatory Visit: Payer: Self-pay

## 2020-03-14 ENCOUNTER — Encounter: Payer: Self-pay | Admitting: Emergency Medicine

## 2020-03-14 ENCOUNTER — Emergency Department
Admission: EM | Admit: 2020-03-14 | Discharge: 2020-03-14 | Disposition: A | Discharge status: Home / Self Care | Payer: Medicaid Other | Attending: Emergency Medicine | Admitting: Emergency Medicine

## 2020-03-14 ENCOUNTER — Emergency Department: Payer: Medicaid Other

## 2020-03-14 DIAGNOSIS — S30811A Abrasion of abdominal wall, initial encounter: Secondary | ICD-10-CM | POA: Insufficient documentation

## 2020-03-14 DIAGNOSIS — S80211A Abrasion, right knee, initial encounter: Secondary | ICD-10-CM | POA: Diagnosis not present

## 2020-03-14 DIAGNOSIS — Z79899 Other long term (current) drug therapy: Secondary | ICD-10-CM | POA: Diagnosis not present

## 2020-03-14 DIAGNOSIS — S80212A Abrasion, left knee, initial encounter: Secondary | ICD-10-CM | POA: Insufficient documentation

## 2020-03-14 DIAGNOSIS — Y999 Unspecified external cause status: Secondary | ICD-10-CM | POA: Diagnosis not present

## 2020-03-14 DIAGNOSIS — Y92410 Unspecified street and highway as the place of occurrence of the external cause: Secondary | ICD-10-CM | POA: Insufficient documentation

## 2020-03-14 DIAGNOSIS — S0990XA Unspecified injury of head, initial encounter: Secondary | ICD-10-CM | POA: Diagnosis present

## 2020-03-14 DIAGNOSIS — R109 Unspecified abdominal pain: Secondary | ICD-10-CM

## 2020-03-14 DIAGNOSIS — F1410 Cocaine abuse, uncomplicated: Secondary | ICD-10-CM | POA: Insufficient documentation

## 2020-03-14 DIAGNOSIS — F1210 Cannabis abuse, uncomplicated: Secondary | ICD-10-CM | POA: Insufficient documentation

## 2020-03-14 DIAGNOSIS — T07XXXA Unspecified multiple injuries, initial encounter: Secondary | ICD-10-CM

## 2020-03-14 DIAGNOSIS — Y939 Activity, unspecified: Secondary | ICD-10-CM | POA: Diagnosis not present

## 2020-03-14 DIAGNOSIS — R0789 Other chest pain: Secondary | ICD-10-CM | POA: Diagnosis not present

## 2020-03-14 DIAGNOSIS — F17210 Nicotine dependence, cigarettes, uncomplicated: Secondary | ICD-10-CM | POA: Insufficient documentation

## 2020-03-14 DIAGNOSIS — R1030 Lower abdominal pain, unspecified: Secondary | ICD-10-CM | POA: Insufficient documentation

## 2020-03-14 LAB — COMPREHENSIVE METABOLIC PANEL
ALT: 21 U/L (ref 0–44)
AST: 22 U/L (ref 15–41)
Albumin: 4 g/dL (ref 3.5–5.0)
Alkaline Phosphatase: 46 U/L (ref 38–126)
Anion gap: 11 (ref 5–15)
BUN: 12 mg/dL (ref 6–20)
CO2: 24 mmol/L (ref 22–32)
Calcium: 9 mg/dL (ref 8.9–10.3)
Chloride: 103 mmol/L (ref 98–111)
Creatinine, Ser: 1.22 mg/dL (ref 0.61–1.24)
GFR calc Af Amer: >60 mL/min (ref >60)
GFR calc non Af Amer: >60 mL/min (ref >60)
Glucose, Bld: 237 mg/dL — ABNORMAL HIGH (ref 70–99)
Potassium: 3.4 mmol/L — ABNORMAL LOW (ref 3.5–5.1)
Sodium: 138 mmol/L (ref 135–145)
Total Bilirubin: 1.1 mg/dL (ref 0.3–1.2)
Total Protein: 7.4 g/dL (ref 6.5–8.1)

## 2020-03-14 LAB — CBC
HCT: 39.9 % (ref 39.0–52.0)
Hemoglobin: 14 g/dL (ref 13.0–17.0)
MCH: 28.7 pg (ref 26.0–34.0)
MCHC: 35.1 g/dL (ref 30.0–36.0)
MCV: 81.8 fL (ref 80.0–100.0)
Platelets: 206 10*3/uL (ref 150–400)
RBC: 4.88 MIL/uL (ref 4.22–5.81)
RDW: 13.8 % (ref 11.5–15.5)
WBC: 10.4 10*3/uL (ref 4.0–10.5)
nRBC: 0 % (ref 0.0–0.2)

## 2020-03-14 MED ORDER — IOHEXOL 300 MG/ML  SOLN
100.0000 mL | Freq: Once | INTRAMUSCULAR | Status: AC | PRN
  Administered 2020-03-14: 16:00:00 100 mL via INTRAVENOUS

## 2020-03-14 MED ORDER — DOUBLE ANTIBIOTIC 500-10000 UNIT/GM EX OINT: 1.0000 "application " | TOPICAL_OINTMENT | Freq: Two times a day (BID) | CUTANEOUS | 0 refills | Status: DC

## 2020-03-14 MED ORDER — IBUPROFEN 600 MG PO TABS
600.0000 mg | ORAL_TABLET | Freq: Once | ORAL | Status: AC
  Administered 2020-03-14: 600 mg via ORAL
  Filled 2020-03-14: qty 1
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
## 2020-06-24 ENCOUNTER — Emergency Department
Admission: EM | Admit: 2020-06-24 | Discharge: 2020-06-24 | Disposition: A | Discharge status: Home / Self Care | Payer: Medicaid Other | Attending: Emergency Medicine | Admitting: Emergency Medicine

## 2020-06-24 ENCOUNTER — Other Ambulatory Visit: Payer: Self-pay

## 2020-06-24 ENCOUNTER — Encounter: Payer: Self-pay | Admitting: Emergency Medicine

## 2020-06-24 DIAGNOSIS — R509 Fever, unspecified: Secondary | ICD-10-CM | POA: Diagnosis not present

## 2020-06-24 DIAGNOSIS — R1030 Lower abdominal pain, unspecified: Secondary | ICD-10-CM | POA: Diagnosis not present

## 2020-06-24 DIAGNOSIS — B974 Respiratory syncytial virus as the cause of diseases classified elsewhere: Secondary | ICD-10-CM | POA: Insufficient documentation

## 2020-06-24 DIAGNOSIS — R197 Diarrhea, unspecified: Secondary | ICD-10-CM | POA: Diagnosis not present

## 2020-06-24 DIAGNOSIS — Z5321 Procedure and treatment not carried out due to patient leaving prior to being seen by health care provider: Secondary | ICD-10-CM | POA: Diagnosis not present

## 2020-06-24 LAB — COMPREHENSIVE METABOLIC PANEL
ALT: 22 U/L (ref 0–44)
AST: 20 U/L (ref 15–41)
Albumin: 4 g/dL (ref 3.5–5.0)
Alkaline Phosphatase: 59 U/L (ref 38–126)
Anion gap: 10 (ref 5–15)
BUN: 11 mg/dL (ref 6–20)
CO2: 21 mmol/L — ABNORMAL LOW (ref 22–32)
Calcium: 8.9 mg/dL (ref 8.9–10.3)
Chloride: 103 mmol/L (ref 98–111)
Creatinine, Ser: 1.3 mg/dL — ABNORMAL HIGH (ref 0.61–1.24)
GFR calc Af Amer: >60 mL/min (ref >60)
GFR calc non Af Amer: >60 mL/min (ref >60)
Glucose, Bld: 225 mg/dL — ABNORMAL HIGH (ref 70–99)
Potassium: 3.8 mmol/L (ref 3.5–5.1)
Sodium: 134 mmol/L — ABNORMAL LOW (ref 135–145)
Total Bilirubin: 0.7 mg/dL (ref 0.3–1.2)
Total Protein: 7.3 g/dL (ref 6.5–8.1)

## 2020-06-24 LAB — URINALYSIS, COMPLETE (UACMP) WITH MICROSCOPIC
Bilirubin Urine: NEGATIVE
Glucose, UA: NEGATIVE mg/dL
Ketones, ur: NEGATIVE mg/dL
Leukocytes,Ua: NEGATIVE
Nitrite: NEGATIVE
Protein, ur: NEGATIVE mg/dL
Specific Gravity, Urine: 1.014 (ref 1.005–1.030)
pH: 5 (ref 5.0–8.0)

## 2020-06-24 LAB — CBC
HCT: 40.3 % (ref 39.0–52.0)
Hemoglobin: 14.2 g/dL (ref 13.0–17.0)
MCH: 29.1 pg (ref 26.0–34.0)
MCHC: 35.2 g/dL (ref 30.0–36.0)
MCV: 82.6 fL (ref 80.0–100.0)
Platelets: 197 10*3/uL (ref 150–400)
RBC: 4.88 MIL/uL (ref 4.22–5.81)
RDW: 13.7 % (ref 11.5–15.5)
WBC: 7.5 10*3/uL (ref 4.0–10.5)
nRBC: 0 % (ref 0.0–0.2)

## 2020-06-24 LAB — LIPASE, BLOOD: Lipase: 40 U/L (ref 11–51)

## 2020-06-24 NOTE — ED Notes (Signed)
Pt called for vital sign recheck. No answer. Pt is not in the lobby.

## 2020-06-24 NOTE — ED Triage Notes (Signed)
Pt here for lower abdominal cramping and diarrhea X 12 today. Denies vomiting. Subjective fevers.  Reports daughter had RSV.  Unlabored. VSS

## 2020-06-24 NOTE — ED Notes (Signed)
Pt called for vital sign recheck. No answer. Pt not in lobby 

## 2020-06-24 NOTE — ED Notes (Signed)
Pt not visualized in lobby. No answer when called for vital sign recheck

## 2020-07-29 ENCOUNTER — Emergency Department: Payer: Medicaid Other

## 2020-07-29 ENCOUNTER — Emergency Department
Admission: EM | Admit: 2020-07-29 | Discharge: 2020-07-29 | Disposition: A | Discharge status: Home / Self Care | Payer: Medicaid Other | Attending: Emergency Medicine | Admitting: Emergency Medicine

## 2020-07-29 ENCOUNTER — Other Ambulatory Visit: Payer: Self-pay

## 2020-07-29 DIAGNOSIS — R079 Chest pain, unspecified: Secondary | ICD-10-CM | POA: Diagnosis present

## 2020-07-29 DIAGNOSIS — E119 Type 2 diabetes mellitus without complications: Secondary | ICD-10-CM | POA: Diagnosis not present

## 2020-07-29 DIAGNOSIS — R0789 Other chest pain: Secondary | ICD-10-CM | POA: Diagnosis not present

## 2020-07-29 DIAGNOSIS — Z79899 Other long term (current) drug therapy: Secondary | ICD-10-CM | POA: Diagnosis not present

## 2020-07-29 DIAGNOSIS — F17210 Nicotine dependence, cigarettes, uncomplicated: Secondary | ICD-10-CM | POA: Insufficient documentation

## 2020-07-29 DIAGNOSIS — J90 Pleural effusion, not elsewhere classified: Secondary | ICD-10-CM | POA: Insufficient documentation

## 2020-07-29 DIAGNOSIS — M10072 Idiopathic gout, left ankle and foot: Secondary | ICD-10-CM | POA: Insufficient documentation

## 2020-07-29 DIAGNOSIS — Z7984 Long term (current) use of oral hypoglycemic drugs: Secondary | ICD-10-CM | POA: Diagnosis not present

## 2020-07-29 LAB — CBC
HCT: 39.3 % (ref 39.0–52.0)
Hemoglobin: 14 g/dL (ref 13.0–17.0)
MCH: 29 pg (ref 26.0–34.0)
MCHC: 35.6 g/dL (ref 30.0–36.0)
MCV: 81.4 fL (ref 80.0–100.0)
Platelets: 212 10*3/uL (ref 150–400)
RBC: 4.83 MIL/uL (ref 4.22–5.81)
RDW: 13.2 % (ref 11.5–15.5)
WBC: 7.8 10*3/uL (ref 4.0–10.5)
nRBC: 0 % (ref 0.0–0.2)

## 2020-07-29 LAB — BASIC METABOLIC PANEL
Anion gap: 9 (ref 5–15)
BUN: 18 mg/dL (ref 6–20)
CO2: 23 mmol/L (ref 22–32)
Calcium: 9 mg/dL (ref 8.9–10.3)
Chloride: 108 mmol/L (ref 98–111)
Creatinine, Ser: 1.34 mg/dL — ABNORMAL HIGH (ref 0.61–1.24)
GFR calc Af Amer: >60 mL/min (ref >60)
GFR calc non Af Amer: >60 mL/min (ref >60)
Glucose, Bld: 169 mg/dL — ABNORMAL HIGH (ref 70–99)
Potassium: 3.8 mmol/L (ref 3.5–5.1)
Sodium: 140 mmol/L (ref 135–145)

## 2020-07-29 LAB — TROPONIN I (HIGH SENSITIVITY)
Troponin I (High Sensitivity): 3 ng/L (ref <18)
Troponin I (High Sensitivity): 3 ng/L (ref <18)

## 2020-07-29 LAB — URIC ACID: Uric Acid, Serum: 5.4 mg/dL (ref 3.7–8.6)

## 2020-07-29 MED ORDER — KETOROLAC TROMETHAMINE 30 MG/ML IJ SOLN
15.0000 mg | Freq: Once | INTRAMUSCULAR | Status: AC
  Administered 2020-07-29: 15 mg via INTRAVENOUS
  Filled 2020-07-29: qty 1

## 2020-07-29 NOTE — Discharge Instructions (Addendum)
Please take Tylenol and ibuprofen/Advil for your pain.  It is safe to take them together, or to alternate them every few hours.  Take up to 1000mg  of Tylenol at a time, up to 4 times per day.  Do not take more than 4000 mg of Tylenol in 24 hours.  For ibuprofen, take 400-600 mg, 4-5 times per day.  The ibuprofen/Advil NSAID medication will be the most helpful for your gout.  There is no evidence of strain or damage in your heart.  As we talked about you have a very small fluid collection around your right lung.  We called to see pleural effusion.  Yours is extremely small, tiny in size.  This is pretty insignificant by itself.  But if you were to develop any worsening shortness of breath, fevers or coughing up mucus, please return to ED.  If you develop any worsening chest pains, especially combination with fevers, passing out or throwing up, please return to the ED.

## 2020-07-29 NOTE — ED Notes (Signed)
Pt observed eating his [employer] food at this time

## 2020-07-29 NOTE — ED Notes (Signed)
Pt sleeping in recliner chair at this time. Rise and fall of chest noted by this RN. Pt in NAD.

## 2020-07-29 NOTE — ED Notes (Signed)
Dc reviewed by provider, papers given. Denies needs or concerns. Ambulated out of ED. AOx4. Talking in fulls sentences with regular and unlabored breathing.
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
## 2020-07-29 NOTE — ED Notes (Signed)
Assumed care of pt at 1900, pt resting in hallway in recliner, up ad lib to restroom. IV placed, medicated per [facility]. Pt provided with hospital phone to call and updated family. AOx4. Talking in full sentences with regular and unlabored breathing.

## 2020-07-29 NOTE — ED Triage Notes (Signed)
Pt states that he was awakened with chest pain this am, pt was observed eating breakfast and asking to borrow a lighter to smoke while waiting for triage, pt states that he is also having right great toe pain that started yesterday states that he has never had gout before, denies any known injury

## 2021-01-18 ENCOUNTER — Emergency Department
Admission: EM | Admit: 2021-01-18 | Discharge: 2021-01-18 | Disposition: A | Discharge status: Home / Self Care | Payer: No Typology Code available for payment source | Attending: Emergency Medicine | Admitting: Emergency Medicine

## 2021-01-18 ENCOUNTER — Other Ambulatory Visit: Payer: Self-pay

## 2021-01-18 ENCOUNTER — Encounter: Payer: Self-pay | Admitting: Emergency Medicine

## 2021-01-18 DIAGNOSIS — R45851 Suicidal ideations: Secondary | ICD-10-CM | POA: Insufficient documentation

## 2021-01-18 DIAGNOSIS — R440 Auditory hallucinations: Secondary | ICD-10-CM | POA: Diagnosis present

## 2021-01-18 DIAGNOSIS — Z20822 Contact with and (suspected) exposure to covid-19: Secondary | ICD-10-CM | POA: Diagnosis not present

## 2021-01-18 DIAGNOSIS — Z1330 Encounter for screening examination for mental health and behavioral disorders, unspecified: Secondary | ICD-10-CM | POA: Insufficient documentation

## 2021-01-18 DIAGNOSIS — R443 Hallucinations, unspecified: Secondary | ICD-10-CM

## 2021-01-18 DIAGNOSIS — F203 Undifferentiated schizophrenia: Secondary | ICD-10-CM | POA: Diagnosis not present

## 2021-01-18 DIAGNOSIS — F1910 Other psychoactive substance abuse, uncomplicated: Secondary | ICD-10-CM | POA: Diagnosis not present

## 2021-01-18 DIAGNOSIS — F209 Schizophrenia, unspecified: Secondary | ICD-10-CM

## 2021-01-18 DIAGNOSIS — F17210 Nicotine dependence, cigarettes, uncomplicated: Secondary | ICD-10-CM | POA: Insufficient documentation

## 2021-01-18 DIAGNOSIS — E119 Type 2 diabetes mellitus without complications: Secondary | ICD-10-CM | POA: Diagnosis not present

## 2021-01-18 DIAGNOSIS — Z7984 Long term (current) use of oral hypoglycemic drugs: Secondary | ICD-10-CM | POA: Insufficient documentation

## 2021-01-18 DIAGNOSIS — I10 Essential (primary) hypertension: Secondary | ICD-10-CM

## 2021-01-18 LAB — URINE DRUG SCREEN, QUALITATIVE (ARMC ONLY)
Amphetamines, Ur Screen: NOT DETECTED
Barbiturates, Ur Screen: NOT DETECTED
Benzodiazepine, Ur Scrn: NOT DETECTED
Cannabinoid 50 Ng, Ur ~~LOC~~: POSITIVE — AB
Cocaine Metabolite,Ur ~~LOC~~: POSITIVE — AB
MDMA (Ecstasy)Ur Screen: NOT DETECTED
Methadone Scn, Ur: NOT DETECTED
Opiate, Ur Screen: NOT DETECTED
Phencyclidine (PCP) Ur S: NOT DETECTED
Tricyclic, Ur Screen: NOT DETECTED

## 2021-01-18 LAB — COMPREHENSIVE METABOLIC PANEL
ALT: 19 U/L (ref 0–44)
AST: 20 U/L (ref 15–41)
Albumin: 4.6 g/dL (ref 3.5–5.0)
Alkaline Phosphatase: 57 U/L (ref 38–126)
Anion gap: 10 (ref 5–15)
BUN: 9 mg/dL (ref 6–20)
CO2: 23 mmol/L (ref 22–32)
Calcium: 9 mg/dL (ref 8.9–10.3)
Chloride: 103 mmol/L (ref 98–111)
Creatinine, Ser: 1 mg/dL (ref 0.61–1.24)
GFR, Estimated: >60 mL/min (ref >60)
Glucose, Bld: 139 mg/dL — ABNORMAL HIGH (ref 70–99)
Potassium: 3.5 mmol/L (ref 3.5–5.1)
Sodium: 136 mmol/L (ref 135–145)
Total Bilirubin: 0.8 mg/dL (ref 0.3–1.2)
Total Protein: 8.3 g/dL — ABNORMAL HIGH (ref 6.5–8.1)

## 2021-01-18 LAB — CBC
HCT: 40.7 % (ref 39.0–52.0)
Hemoglobin: 14.4 g/dL (ref 13.0–17.0)
MCH: 28.3 pg (ref 26.0–34.0)
MCHC: 35.4 g/dL (ref 30.0–36.0)
MCV: 80 fL (ref 80.0–100.0)
Platelets: 212 10*3/uL (ref 150–400)
RBC: 5.09 MIL/uL (ref 4.22–5.81)
RDW: 14.5 % (ref 11.5–15.5)
WBC: 9.2 10*3/uL (ref 4.0–10.5)
nRBC: 0 % (ref 0.0–0.2)

## 2021-01-18 LAB — ETHANOL: Alcohol, Ethyl (B): 16 mg/dL — ABNORMAL HIGH (ref <10)

## 2021-01-18 LAB — RESP PANEL BY RT-PCR (FLU A&B, COVID) ARPGX2
Influenza A by PCR: NEGATIVE
Influenza B by PCR: NEGATIVE
SARS Coronavirus 2 by RT PCR: NEGATIVE

## 2021-01-18 MED ORDER — PALIPERIDONE PALMITATE ER 156 MG/ML IM SUSY: 156.0000 mg | PREFILLED_SYRINGE | INTRAMUSCULAR | 1 refills | Status: DC

## 2021-01-18 MED ORDER — PALIPERIDONE PALMITATE ER 156 MG/ML IM SUSY
156.0000 mg | PREFILLED_SYRINGE | Freq: Once | INTRAMUSCULAR | Status: AC
  Administered 2021-01-18: 156 mg via INTRAMUSCULAR
  Filled 2021-01-18: qty 1

## 2021-01-18 MED ORDER — METFORMIN HCL 500 MG PO TABS: 500.0000 mg | ORAL_TABLET | Freq: Two times a day (BID) | ORAL | Status: DC

## 2021-01-18 MED ORDER — METFORMIN HCL 500 MG PO TABS
500.0000 mg | ORAL_TABLET | Freq: Two times a day (BID) | ORAL | 1 refills | Status: AC
Start: 2021-01-18 — End: ?

## 2021-01-18 MED ORDER — LISINOPRIL 10 MG PO TABS: 10.0000 mg | ORAL_TABLET | Freq: Every day | ORAL | 1 refills | Status: DC
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
## 2021-01-18 NOTE — ED Notes (Signed)
Black high top tennis shoes, black socks, khaki pants, black short sleeve t shirt, black watch, pt placed in pocket of jacket, camo jacket placed in pt belonging bags and given to quad RN.

## 2021-01-18 NOTE — ED Triage Notes (Signed)
Pt states he is here to "get his meds fixed." States he has been off of them for about 6 months now, is having some bipolar tendencies and schizophrenia. Denies HI. Had "si thoughts" without a plan recently. Admits to having drugs and alcohol in his system currently. Calm and cooperative in triage.
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
## 2021-01-18 NOTE — Discharge Instructions (Addendum)
Please seek medical attention and help for any thoughts about wanting to harm yourself, harm others, any concerning change in behavior, severe depression, inappropriate drug use or any other new or concerning symptoms. ° °

## 2021-09-25 ENCOUNTER — Emergency Department
Admission: EM | Admit: 2021-09-25 | Discharge: 2021-09-25 | Discharge status: Against Medical Advice | Payer: No Typology Code available for payment source

## 2023-01-06 ENCOUNTER — Emergency Department
Admission: EM | Admit: 2023-01-06 | Discharge: 2023-01-06 | Disposition: A | Discharge status: Home / Self Care | Payer: Medicaid Other | Attending: Emergency Medicine | Admitting: Emergency Medicine

## 2023-01-06 ENCOUNTER — Other Ambulatory Visit: Payer: Self-pay

## 2023-01-06 ENCOUNTER — Encounter: Payer: Self-pay | Admitting: Emergency Medicine

## 2023-01-06 DIAGNOSIS — Z20822 Contact with and (suspected) exposure to covid-19: Secondary | ICD-10-CM | POA: Insufficient documentation

## 2023-01-06 DIAGNOSIS — J020 Streptococcal pharyngitis: Secondary | ICD-10-CM

## 2023-01-06 DIAGNOSIS — I10 Essential (primary) hypertension: Secondary | ICD-10-CM | POA: Diagnosis not present

## 2023-01-06 DIAGNOSIS — R519 Headache, unspecified: Secondary | ICD-10-CM | POA: Diagnosis present

## 2023-01-06 DIAGNOSIS — E119 Type 2 diabetes mellitus without complications: Secondary | ICD-10-CM | POA: Diagnosis not present

## 2023-01-06 LAB — RESP PANEL BY RT-PCR (RSV, FLU A&B, COVID)  RVPGX2
Influenza A by PCR: NEGATIVE
Influenza B by PCR: NEGATIVE
Resp Syncytial Virus by PCR: NEGATIVE
SARS Coronavirus 2 by RT PCR: NEGATIVE

## 2023-01-06 LAB — GROUP A STREP BY PCR: Group A Strep by PCR: DETECTED — AB

## 2023-01-06 MED ORDER — ACETAMINOPHEN 325 MG PO TABS
650.0000 mg | ORAL_TABLET | Freq: Once | ORAL | Status: AC
  Administered 2023-01-06: 650 mg via ORAL
  Filled 2023-01-06: qty 2

## 2023-01-06 MED ORDER — AMOXICILLIN-POT CLAVULANATE 875-125 MG PO TABS: 1.0000 | ORAL_TABLET | Freq: Two times a day (BID) | ORAL | 0 refills | Status: AC

## 2023-01-06 MED ORDER — IBUPROFEN 600 MG PO TABS
600.0000 mg | ORAL_TABLET | Freq: Once | ORAL | Status: AC
  Administered 2023-01-06: 600 mg via ORAL
  Filled 2023-01-06: qty 1

## 2023-01-06 NOTE — ED Triage Notes (Signed)
Patient to ED for flu like symptoms. Headache, body aches, and diarrhea. States family had flu and Covid recently. NAD noted.

## 2023-01-06 NOTE — ED Provider Notes (Signed)
[facility] Provider Note    Event Date/Time   First MD Initiated Contact with Patient 01/06/23 [phone]     (approximate)   History   URI   HPI  Cory Pruitt is a 47 y.o. male who presents today for evaluation of headache, body aches, and sore throat.  No voice change. He reports that his family had both COVID and flu recently.  Patient reports that he was taking care of his sick family numbers, and over the past 3 days he has developed similar symptoms.  He reports that he has cough, sore throat, body aches, and headache.  He has not taken anything for his symptoms.  Patient Active Problem List   Diagnosis Date Noted   Schizophrenia ([place])    Diabetes mellitus without complication ([place])    Hypertension           Physical Exam   Triage Vital Signs: ED Triage Vitals  Enc Vitals Group     BP 01/06/23 0909 (!) 129/114     Pulse Rate 01/06/23 0908 81     Resp 01/06/23 0908 18     Temp 01/06/23 0908 98.2 F (36.8 C)     Temp Source 01/06/23 0908 Oral     SpO2 01/06/23 0908 98 %     Weight --      Height --      Head Circumference --      Peak Flow --      Pain Score 01/06/23 0908 8     Pain Loc --      Pain Edu? --      Excl. in [place]? --     Most recent vital signs: Vitals:   01/06/23 0908 01/06/23 0909  BP:  (!) 129/114  Pulse: 81   Resp: 18   Temp: 98.2 F (36.8 C)   SpO2: 98%     Physical Exam Vitals and nursing note reviewed.  Constitutional:      General: Awake and alert. No acute distress.    Appearance: Normal appearance. The patient is normal weight.  HENT:     Head: Normocephalic and atraumatic.     Mouth: Mucous membranes are moist. Uvula midline.  Posterior oropharyngeal erythema. No tonsillar exudate.  No soft palate fluctuance.  No trismus.  No voice change.  No sublingual swelling.  No tender cervical lymphadenopathy.  No nuchal rigidity Nasal congestion present Eyes:     General: PERRL. Normal EOMs        Right  eye: No discharge.        Left eye: No discharge.     Conjunctiva/sclera: Conjunctivae normal.  Cardiovascular:     Rate and Rhythm: Normal rate and regular rhythm.     Pulses: Normal pulses.  Pulmonary:     Effort: Pulmonary effort is normal. No respiratory distress.     Breath sounds: Normal breath sounds.  Abdominal:     Abdomen is soft. There is no abdominal tenderness. No rebound or guarding. No distention. Musculoskeletal:        General: No swelling. Normal range of motion.     Cervical back: Normal range of motion and neck supple.  Skin:    General: Skin is warm and dry.     Capillary Refill: Capillary refill takes less than 2 seconds.     Findings: No rash.  Neurological:     Mental Status: The patient is awake and alert.      ED Results /  Procedures / Treatments   Labs (all labs ordered are listed, but only abnormal results are displayed) Labs Reviewed  GROUP A STREP BY PCR - Abnormal; Notable for the following components:      Result Value   Group A Strep by PCR DETECTED (*)    All other components within normal limits  RESP PANEL BY RT-PCR (RSV, FLU A&B, COVID)  RVPGX2     EKG     RADIOLOGY     PROCEDURES:  Critical Care performed:   Procedures   MEDICATIONS ORDERED IN ED: Medications  acetaminophen (TYLENOL) tablet 650 mg (650 mg Oral Given 01/06/23 0947)  ibuprofen (ADVIL) tablet 600 mg (600 mg Oral Given 01/06/23 0947)     IMPRESSION / MDM / ASSESSMENT AND PLAN / ED COURSE  I reviewed the triage vital signs and the nursing notes.   Differential diagnosis includes, but is not limited to, COVID, flu, RSV, bronchitis/pneumonia, other URI.  Patient is awake and alert, hemodynamically stable and afebrile.  He is actively eating a large meal.  He is nontoxic in appearance.  He has a normal oxygen saturation on room air.  He was treated symptomatically with improvement of symptoms.  COVID/flu/RSV was negative, though strep was positive.  He was  started on antibiotics and instructed to complete the entire course of therapy even if he begins to feel better sooner in order to prevent post-streptococcal complications.  There is no uvular deviation, trismus, voice change, drooling, neck pain or stiffness to suggest peritonsillar or retropharyngeal abscess.  We discussed return precautions and the importance of close outpatient follow-up.  Patient understands and agrees with plan.  He was discharged in stable condition.   Patient's presentation is most consistent with acute complicated illness / injury requiring diagnostic workup.     FINAL CLINICAL IMPRESSION(S) / ED DIAGNOSES   Final diagnoses:  Strep pharyngitis     Rx / DC Orders   ED Discharge Orders          Ordered    amoxicillin-clavulanate (AUGMENTIN) 875-125 MG tablet  2 times daily        01/06/23 1131             Note:  This document was prepared using Dragon voice recognition software and may include unintentional dictation errors.   Emeline Gins 01/06/23 1139    Lavonia Drafts, MD 01/06/23 1326

## 2023-01-06 NOTE — Discharge Instructions (Addendum)
Your strep test is positive.  Please take antibiotics as prescribed.  You may continue to take Tylenol/ibuprofen per package instructions to help with your symptoms.  Please return for any new, worsening, or change in symptoms or other concerns.

## 2023-12-17 ENCOUNTER — Emergency Department
Admission: EM | Admit: 2023-12-17 | Discharge: 2023-12-17 | Disposition: A | Discharge status: Other Acute Inpatient Hospital | Payer: MEDICAID | Attending: Emergency Medicine | Admitting: Emergency Medicine

## 2023-12-17 ENCOUNTER — Encounter (HOSPITAL_COMMUNITY): Payer: Self-pay | Admitting: Psychiatry

## 2023-12-17 ENCOUNTER — Other Ambulatory Visit (HOSPITAL_COMMUNITY)
Admission: EM | Admit: 2023-12-17 | Discharge: 2023-12-19 | Disposition: A | Discharge status: Home / Self Care | Payer: MEDICAID | Attending: Psychiatry | Admitting: Psychiatry

## 2023-12-17 ENCOUNTER — Other Ambulatory Visit: Payer: Self-pay

## 2023-12-17 DIAGNOSIS — F259 Schizoaffective disorder, unspecified: Secondary | ICD-10-CM | POA: Diagnosis present

## 2023-12-17 DIAGNOSIS — F1910 Other psychoactive substance abuse, uncomplicated: Secondary | ICD-10-CM | POA: Diagnosis not present

## 2023-12-17 DIAGNOSIS — F209 Schizophrenia, unspecified: Secondary | ICD-10-CM | POA: Diagnosis not present

## 2023-12-17 DIAGNOSIS — E785 Hyperlipidemia, unspecified: Secondary | ICD-10-CM | POA: Insufficient documentation

## 2023-12-17 DIAGNOSIS — F250 Schizoaffective disorder, bipolar type: Secondary | ICD-10-CM | POA: Diagnosis not present

## 2023-12-17 DIAGNOSIS — I10 Essential (primary) hypertension: Secondary | ICD-10-CM | POA: Insufficient documentation

## 2023-12-17 DIAGNOSIS — Z560 Unemployment, unspecified: Secondary | ICD-10-CM | POA: Insufficient documentation

## 2023-12-17 DIAGNOSIS — F1590 Other stimulant use, unspecified, uncomplicated: Secondary | ICD-10-CM

## 2023-12-17 DIAGNOSIS — F1410 Cocaine abuse, uncomplicated: Secondary | ICD-10-CM | POA: Diagnosis not present

## 2023-12-17 DIAGNOSIS — F1424 Cocaine dependence with cocaine-induced mood disorder: Secondary | ICD-10-CM | POA: Diagnosis present

## 2023-12-17 DIAGNOSIS — E119 Type 2 diabetes mellitus without complications: Secondary | ICD-10-CM | POA: Diagnosis not present

## 2023-12-17 DIAGNOSIS — Z720 Tobacco use: Secondary | ICD-10-CM | POA: Insufficient documentation

## 2023-12-17 DIAGNOSIS — Z91148 Patient's other noncompliance with medication regimen for other reason: Secondary | ICD-10-CM | POA: Diagnosis not present

## 2023-12-17 DIAGNOSIS — F1414 Cocaine abuse with cocaine-induced mood disorder: Secondary | ICD-10-CM | POA: Diagnosis not present

## 2023-12-17 DIAGNOSIS — R45851 Suicidal ideations: Secondary | ICD-10-CM | POA: Diagnosis not present

## 2023-12-17 DIAGNOSIS — F17210 Nicotine dependence, cigarettes, uncomplicated: Secondary | ICD-10-CM | POA: Insufficient documentation

## 2023-12-17 DIAGNOSIS — F17200 Nicotine dependence, unspecified, uncomplicated: Secondary | ICD-10-CM

## 2023-12-17 DIAGNOSIS — Z7984 Long term (current) use of oral hypoglycemic drugs: Secondary | ICD-10-CM | POA: Insufficient documentation

## 2023-12-17 DIAGNOSIS — Z79899 Other long term (current) drug therapy: Secondary | ICD-10-CM | POA: Insufficient documentation

## 2023-12-17 LAB — CBC
HCT: 40.8 % (ref 39.0–52.0)
Hemoglobin: 14.5 g/dL (ref 13.0–17.0)
MCH: 28.7 pg (ref 26.0–34.0)
MCHC: 35.5 g/dL (ref 30.0–36.0)
MCV: 80.8 fL (ref 80.0–100.0)
Platelets: 204 10*3/uL (ref 150–400)
RBC: 5.05 MIL/uL (ref 4.22–5.81)
RDW: 13.2 % (ref 11.5–15.5)
WBC: 7.9 10*3/uL (ref 4.0–10.5)
nRBC: 0 % (ref 0.0–0.2)

## 2023-12-17 LAB — ETHANOL: Alcohol, Ethyl (B): <10 mg/dL (ref <10)

## 2023-12-17 LAB — COMPREHENSIVE METABOLIC PANEL
ALT: 17 U/L (ref 0–44)
AST: 14 U/L — ABNORMAL LOW (ref 15–41)
Albumin: 4.2 g/dL (ref 3.5–5.0)
Alkaline Phosphatase: 52 U/L (ref 38–126)
Anion gap: 11 (ref 5–15)
BUN: 13 mg/dL (ref 6–20)
CO2: 21 mmol/L — ABNORMAL LOW (ref 22–32)
Calcium: 9.2 mg/dL (ref 8.9–10.3)
Chloride: 102 mmol/L (ref 98–111)
Creatinine, Ser: 1.03 mg/dL (ref 0.61–1.24)
GFR, Estimated: >60 mL/min (ref >60)
Glucose, Bld: 258 mg/dL — ABNORMAL HIGH (ref 70–99)
Potassium: 3.7 mmol/L (ref 3.5–5.1)
Sodium: 134 mmol/L — ABNORMAL LOW (ref 135–145)
Total Bilirubin: 0.9 mg/dL (ref 0.0–1.2)
Total Protein: 7.9 g/dL (ref 6.5–8.1)

## 2023-12-17 LAB — SALICYLATE LEVEL: Salicylate Lvl: <7 mg/dL — ABNORMAL LOW (ref 7.0–30.0)

## 2023-12-17 LAB — URINE DRUG SCREEN, QUALITATIVE (ARMC ONLY)
Amphetamines, Ur Screen: NOT DETECTED
Barbiturates, Ur Screen: NOT DETECTED
Benzodiazepine, Ur Scrn: NOT DETECTED
Cannabinoid 50 Ng, Ur ~~LOC~~: NOT DETECTED
Cocaine Metabolite,Ur ~~LOC~~: POSITIVE — AB
MDMA (Ecstasy)Ur Screen: NOT DETECTED
Methadone Scn, Ur: NOT DETECTED
Opiate, Ur Screen: NOT DETECTED
Phencyclidine (PCP) Ur S: NOT DETECTED
Tricyclic, Ur Screen: NOT DETECTED

## 2023-12-17 LAB — ACETAMINOPHEN LEVEL: Acetaminophen (Tylenol), Serum: <10 ug/mL — ABNORMAL LOW (ref 10–30)

## 2023-12-17 MED ORDER — LORAZEPAM 2 MG/ML IJ SOLN: 2.0000 mg | Freq: Three times a day (TID) | INTRAMUSCULAR | Status: DC | PRN

## 2023-12-17 MED ORDER — HYDROXYZINE HCL 25 MG PO TABS: 25.0000 mg | ORAL_TABLET | Freq: Three times a day (TID) | ORAL | Status: DC | PRN

## 2023-12-17 MED ORDER — HALOPERIDOL 5 MG PO TABS
5.0000 mg | ORAL_TABLET | Freq: Three times a day (TID) | ORAL | Status: DC | PRN
Start: 2023-12-17 — End: 2023-12-19

## 2023-12-17 MED ORDER — ALUM & MAG HYDROXIDE-SIMETH 200-200-20 MG/5ML PO SUSP: 30.0000 mL | ORAL | Status: DC | PRN

## 2023-12-17 MED ORDER — PALIPERIDONE ER 6 MG PO TB24
6.0000 mg | ORAL_TABLET | Freq: Every day | ORAL | Status: DC
Start: 2023-12-18 — End: 2023-12-19
  Administered 2023-12-18 – 2023-12-19 (×2): 6 mg via ORAL
  Filled 2023-12-17 (×2): qty 1

## 2023-12-17 MED ORDER — DIPHENHYDRAMINE HCL 50 MG/ML IJ SOLN: 50.0000 mg | Freq: Three times a day (TID) | INTRAMUSCULAR | Status: DC | PRN

## 2023-12-17 MED ORDER — HALOPERIDOL LACTATE 5 MG/ML IJ SOLN: 10.0000 mg | Freq: Three times a day (TID) | INTRAMUSCULAR | Status: DC | PRN

## 2023-12-17 MED ORDER — DIPHENHYDRAMINE HCL 50 MG PO CAPS: 50.0000 mg | ORAL_CAPSULE | Freq: Three times a day (TID) | ORAL | Status: DC | PRN

## 2023-12-17 MED ORDER — ACETAMINOPHEN 325 MG PO TABS: 650.0000 mg | ORAL_TABLET | Freq: Four times a day (QID) | ORAL | Status: DC | PRN

## 2023-12-17 MED ORDER — PALIPERIDONE ER 3 MG PO TB24
6.0000 mg | ORAL_TABLET | Freq: Every day | ORAL | Status: DC
  Administered 2023-12-17: 6 mg via ORAL
  Filled 2023-12-17: qty 2

## 2023-12-17 MED ORDER — TRAZODONE HCL 50 MG PO TABS: 50.0000 mg | ORAL_TABLET | Freq: Every evening | ORAL | Status: DC | PRN

## 2023-12-17 MED ORDER — HALOPERIDOL LACTATE 5 MG/ML IJ SOLN: 5.0000 mg | Freq: Three times a day (TID) | INTRAMUSCULAR | Status: DC | PRN

## 2023-12-17 MED ORDER — LISINOPRIL 10 MG PO TABS
10.0000 mg | ORAL_TABLET | Freq: Every day | ORAL | Status: DC
  Administered 2023-12-18 – 2023-12-19 (×2): 10 mg via ORAL
  Filled 2023-12-17 (×2): qty 1

## 2023-12-17 MED ORDER — ATORVASTATIN CALCIUM 10 MG PO TABS
20.0000 mg | ORAL_TABLET | Freq: Every day | ORAL | Status: DC
  Administered 2023-12-18 – 2023-12-19 (×2): 20 mg via ORAL
  Filled 2023-12-17 (×2): qty 2

## 2023-12-17 MED ORDER — MAGNESIUM HYDROXIDE 400 MG/5ML PO SUSP: 30.0000 mL | Freq: Every day | ORAL | Status: DC | PRN

## 2023-12-17 MED ORDER — METFORMIN HCL 500 MG PO TABS
500.0000 mg | ORAL_TABLET | Freq: Two times a day (BID) | ORAL | Status: DC
  Administered 2023-12-18 – 2023-12-19 (×3): 500 mg via ORAL
  Filled 2023-12-17 (×3): qty 1

## 2023-12-17 NOTE — ED Provider Notes (Signed)
[facility] Provider Note    Event Date/Time   First MD Initiated Contact with Patient 12/17/23 [phone]     (approximate)   History   Suicidal and Hallucinations   HPI  Cory Pruitt is a 48 y.o. male with schizoaffective disorder who comes in with concerns for SI, hallucinations.  Patient reports he has been out of his [name] for the past 3 months.  He states that when this happens he develops auditory hallucinations and SI.  He denies any active plan but he is tearful stating that when he gets like this he turns to drugs.  He denies any other medical concerns.  Physical Exam   Triage Vital Signs: ED Triage Vitals  Encounter Vitals Group     BP 12/17/23 1000 (!) 123/92     Systolic BP Percentile --      Diastolic BP Percentile --      Pulse Rate 12/17/23 1000 99     Resp 12/17/23 1000 18     Temp 12/17/23 0959 98.7 F (37.1 C)     Temp src --      SpO2 12/17/23 1000 98 %     Weight 12/17/23 1001 240 lb (108.9 kg)     Height 12/17/23 1001 6' (1.829 m)     Head Circumference --      Peak Flow --      Pain Score 12/17/23 1000 0     Pain Loc --      Pain Education --      Exclude from Growth Chart --     Most recent vital signs: Vitals:   12/17/23 0959 12/17/23 1000  BP:  (!) 123/92  Pulse:  99  Resp:  18  Temp: 98.7 F (37.1 C)   SpO2:  98%     General: Awake, no distress. + tearful CV:  Good peripheral perfusion.  Resp:  Normal effort.  Abd:  No distention.  Other:  + hallucinations + SI    ED Results / Procedures / Treatments   Labs (all labs ordered are listed, but only abnormal results are displayed) Labs Reviewed  CBC  COMPREHENSIVE METABOLIC PANEL  ETHANOL  SALICYLATE LEVEL  ACETAMINOPHEN LEVEL  URINE DRUG SCREEN, QUALITATIVE ([facility] ONLY)     EKG  My interpretation of EKG:  Sinus tachycardia rate of 141 without any ST elevation T wave inversions, normal intervals    PROCEDURES:  Critical Care performed:  No  Procedures   MEDICATIONS ORDERED IN ED: Medications - No data to display   IMPRESSION / MDM / ASSESSMENT AND PLAN / ED COURSE  I reviewed the triage vital signs and the nursing notes.   Patient's presentation is most consistent with acute presentation with potential threat to life or bodily function.   Pt is without any acute medical complaints. No exam findings to suggest medical cause of current presentation. Will order psychiatric screening labs and discuss further w/ psychiatric service.  D/d includes but is not limited to psychiatric disease, behavioral/personality disorder, inadequate socioeconomic support, medical.  Based on HPI, exam, unremarkable labs, no concern for acute medical problem at this time. No rigidity, clonus, hyperthermia, focal neurologic deficit, diaphoresis, tachycardia, meningismus, ataxia, gait abnormality or other finding to suggest this visit represents a non-psychiatric problem. Screening labs reviewed.    Given this, pt medically cleared, to be dispositioned per Psych.    The patient has been placed in psychiatric observation due to the need to provide a safe  environment for the patient while obtaining psychiatric consultation and evaluation, as well as ongoing medical and medication management to treat the patient's condition.  The patient has not been placed under full IVC at this time.    Patient reports that he wants to be here.  He denies any active plan for his SI states that he wants to be seen so a psychiatry and would like to be here voluntarily.  CBC reassuring CMP shows slightly elevated glucose but anion gap is normal.  Salicylate Tylenol negative       FINAL CLINICAL IMPRESSION(S) / ED DIAGNOSES   Final diagnoses:  Suicidal thoughts  Substance abuse (HCC)     Rx / DC Orders   ED Discharge Orders     None        Note:  This document was prepared using Dragon voice recognition software and may include unintentional  dictation errors.   Concha Se, MD 12/17/23 [phone]

## 2023-12-17 NOTE — ED Notes (Signed)
[facility] Called for transport to [facility]
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
## 2023-12-17 NOTE — ED Notes (Signed)
Pt A&O x 4, no distress noted, calm & cooperative, monitoring for safety.  Contracts for safety.

## 2023-12-17 NOTE — BH Assessment (Signed)
Comprehensive Clinical Assessment (CCA) Note  12/17/2023 SOSA PACIFICO [id number]  Chief Complaint:  Chief Complaint  Patient presents with   Suicidal   Hallucinations   Visit Diagnosis: Schizophrenia   Cory Pruitt is a 48 year old male, who presents with symptoms of stress and emotional distress. The has a history of schizophrenia, [name] was previously prescribed but the client is currently not taking it. He reports feeling "less strong" and has been crying during the interview. The client admits to medication non-compliance and subsequent cocaine use, with the most recent use occurring yesterday following a stressful situation. He denies daily cocaine use but reports using it about 3 weeks ago. The patient also reports consuming one 16-ounce beer recently but denies daily alcohol use. He smokes approximately one pack of cigarettes daily. The client denies current hallucinations or suicidal/homicidal ideation. He reports a good appetite, eating 3-4 meals per day, and sleeping well, typically from 9 PM to 6:30-7 AM.    CCA Screening, Triage and Referral (STR)  Patient Reported Information How did you hear about [place]? Self  What Is the Reason for Your Visit/Call Today? Patient hasn't had his mental health meds in approximately two weeks.  How Long Has This Been Causing You Problems? 1-6 months  What Do You Feel Would Help You the Most Today? Treatment for Depression or other mood problem   Have You Recently Had Any Thoughts About Hurting Yourself? No  Are You Planning to Commit Suicide/Harm Yourself At This time? No   Flowsheet Row ED from 12/17/2023 in [facility] Emergency Department at [facility] ED from 01/06/2023 in [facility] Emergency Department at [facility] ED from 01/18/2021 in [facility] Department at [facility]  C-SSRS RISK CATEGORY Error: Q3, 4, or 5 should not be populated when Q2 is No No Risk Error: Q3, 4, or 5 should not be populated  when Q2 is No       Have you Recently Had Thoughts About Hurting Someone [name]? No  Are You Planning to Harm Someone at This Time? No  Explanation: No data recorded  Have You Used Any Alcohol or Drugs in the Past 24 Hours? Yes  How Long Ago Did You Use Drugs or Alcohol? No data recorded What Did You Use and How Much? No data recorded  Do You Currently Have a Therapist/Psychiatrist? No  Name of Therapist/Psychiatrist:    Have You Been Recently Discharged From Any Office Practice or Programs? No  Explanation of Discharge From Practice/Program: No data recorded    CCA Screening Triage Referral Assessment Type of Contact: Face-to-Face  Telemedicine Service Delivery:   Is this Initial or Reassessment?   Date Telepsych consult ordered in [facility]:    Time Telepsych consult ordered in [facility]:    Location of Assessment: [facility] ED  Provider Location: [facility] ED   Collateral Involvement: No data recorded  Does Patient Have a Court Appointed Legal Guardian? No  Legal Guardian Contact Information: No data recorded Copy of Legal Guardianship Form: No data recorded Legal Guardian Notified of Arrival: No data recorded Legal Guardian Notified of Pending Discharge: No data recorded If Minor and Not Living with Parent(s), Who has Custody? No data recorded Is CPS involved or ever been involved? Never  Is APS involved or ever been involved? Never   Patient Determined To Be At Risk for Harm To Self or Others Based on Review of Patient Reported Information or Presenting Complaint? No  Method: No data recorded Availability of Means: No data recorded  Intent: No data recorded Notification Required: No data recorded Additional Information for Danger to Others Potential: No data recorded Additional Comments for Danger to Others Potential: No data recorded Are There Guns or Other Weapons in Your Home? No  Types of Guns/Weapons: No data recorded Are These Weapons Safely Secured?                             No  Who Could Verify You Are Able To Have These Secured: No data recorded Do You Have any Outstanding Charges, Pending Court Dates, Parole/Probation? No data recorded Contacted To Inform of Risk of Harm To Self or Others: No data recorded   Does Patient Present under Involuntary Commitment? No    [place] of Residence: [place]   Patient Currently Receiving the Following Services: Not Receiving Services   Determination of Need: Emergent (2 hours)   Options For Referral: Inpatient Hospitalization; ED Visit     CCA Biopsychosocial Patient Reported Schizophrenia/Schizoaffective Diagnosis in Past: No   Strengths: Patient hasn't had his mental health meds in approximately two weeks.   Mental Health Symptoms Depression:  Change in energy/activity; Worthlessness   Duration of Depressive symptoms: Duration of Depressive Symptoms: Less than two weeks   Mania:  Change in energy/activity; Racing thoughts; Recklessness   Anxiety:   Tension; Sleep; Restlessness; Difficulty concentrating   Psychosis:  None   Duration of Psychotic symptoms:    Trauma:  N/A   Obsessions:  N/A   Compulsions:  N/A   Inattention:  N/A   Hyperactivity/Impulsivity:  N/A   Oppositional/Defiant Behaviors:  N/A   Emotional Irregularity:  N/A   Other Mood/Personality Symptoms:  No data recorded   Mental Status Exam Appearance and self-care  Stature:  Average   Weight:  Average weight   Clothing:  No data recorded  Grooming:  Normal   Cosmetic use:  None   Posture/gait:  Normal   Motor activity:  -- (Within normal range)   Sensorium  Attention:  Normal   Concentration:  Normal   Orientation:  X5   Recall/memory:  Normal   Affect and Mood  Affect:  Anxious; Depressed; Full Range   Mood:  Depressed; Anxious   Relating  Eye contact:  Fleeting   Facial expression:  Responsive; Depressed   Attitude toward examiner:  Cooperative   Thought and Language  Speech  flow: Clear and Coherent; Normal   Thought content:  Appropriate to Mood and Circumstances   Preoccupation:  None   Hallucinations:  None   Organization:  Intact; Coherent   [employer] of Knowledge:  Fair   Intelligence:  Average   Abstraction:  Functional   Judgement:  Fair   Dance movement psychotherapist:  Adequate   Insight:  Fair   Decision Making:  Impulsive   Social Functioning  Social Maturity:  Isolates   Social Judgement:  "Chief of Staff"; Heedless   Stress  Stressors:  Family conflict; Other (Comment); Relationship   Coping Ability:  Normal   Skill Deficits:  None   Supports:  Family; Friends/Service system     Religion: Religion/Spirituality Are You A Religious Person?: No  Leisure/Recreation: Leisure / Recreation Do You Have Hobbies?: No  Exercise/Diet: Exercise/Diet Do You Exercise?: No Have You Gained or Lost A Significant Amount of Weight in the Past Six Months?: No Do You Follow a Special Diet?: No Do You Have Any Trouble Sleeping?: Yes Explanation of Sleeping Difficulties: Decreased  CCA Employment/Education Employment/Work Situation: Employment / Work Clinical biochemist has Been Impacted by Current Illness: No Has Patient ever Been in Equities trader?: No  Education: Education Is Patient Currently Attending School?: No Did You Have An Individualized Education Program (IIEP): No Did You Have Any Difficulty At [employer]?: No Patient's Education Has Been Impacted by Current Illness: No   CCA Family/Childhood History Family and Relationship History: Family history Marital status: Single Does patient have children?: Yes How many children?: 1 How is patient's relationship with their children?: Have a good relationship with his son.  Childhood History:  Childhood History Did patient suffer any verbal/emotional/physical/sexual abuse as a child?: No Did patient suffer from severe childhood neglect?: No Has patient ever been  sexually abused/assaulted/raped as an adolescent or adult?: No Was the patient ever a victim of a crime or a disaster?: No Witnessed domestic violence?: No Has patient been affected by domestic violence as an adult?: No       CCA Substance Use Alcohol/Drug Use: Alcohol / Drug Use Pain Medications: See MAR Prescriptions: See MAR Over the Counter: See MAR History of alcohol / drug use?: Yes Longest period of sobriety (when/how long): Unable to quantify Substance #1 Name of Substance 1: Alcohol Substance #2 Name of Substance 2: Cocaine     ASAM's:  Six Dimensions of Multidimensional Assessment  Dimension 1:  Acute Intoxication and/or Withdrawal Potential:      Dimension 2:  Biomedical Conditions and Complications:      Dimension 3:  Emotional, Behavioral, or Cognitive Conditions and Complications:     Dimension 4:  Readiness to Change:     Dimension 5:  Relapse, Continued use, or Continued Problem Potential:     Dimension 6:  Recovery/Living Environment:     ASAM Severity Score:    ASAM Recommended Level of Treatment:     Substance use Disorder (SUD)    Recommendations for Services/Supports/Treatments:    Disposition Recommendation per psychiatric provider: Inpatient Treatment   DSM5 Diagnoses: Patient Active Problem List   Diagnosis Date Noted   Schizophrenia (HCC)    Diabetes mellitus without complication (HCC)    Hypertension      Referrals to Alternative Service(s): Referred to Alternative Service(s):   Place:   Date:   Time:    Referred to Alternative Service(s):   Place:   Date:   Time:    Referred to Alternative Service(s):   Place:   Date:   Time:    Referred to Alternative Service(s):   Place:   Date:   Time:     Lilyan Gilford MS, LCAS, [facility], [facility] Therapeutic Triage Specialist 12/17/2023 3:04 PM

## 2023-12-17 NOTE — ED Notes (Signed)
Pt sleeping at present, no distress noted.  Monitoring for safety. 

## 2023-12-17 NOTE — Group Note (Signed)
Group Topic: Social Support  Group Date: 12/17/2023 Start Time: 1900 End Time: 1930 Facilitators: Rae Lips B  Department: [facility]  Number of Participants: 2  Group Focus: acceptance, anxiety, check in, clarity of thought, depression, family, and feeling awareness/expression Treatment Modality:  Individual Therapy Interventions utilized were leisure development Purpose: express feelings  Name: Cory Pruitt Date of Birth: 11-Aug-1976  MR: [id number]    Level of Participation: minimal Quality of Participation: cooperative Interactions with others: gave feedback Mood/Affect: appropriate Triggers (if applicable): NA Cognition: coherent/clear Progress: Gaining insight Response: NA Plan: patient will be encouraged to go to groups.   Patients Problems:  Patient Active Problem List   Diagnosis Date Noted   Cocaine abuse with cocaine-induced mood disorder (HCC) 12/17/2023   Schizoaffective disorder (HCC) 12/17/2023   Schizophrenia (HCC)    Diabetes mellitus without complication (HCC)    Hypertension

## 2023-12-17 NOTE — ED Triage Notes (Signed)
Pt to ED via [facility] for auditory hallucinations stating to hurt self and SI. Reports has been using cocaine to try to self medicate, last use 2 hours ago.  Pt tearful and withdrawn in triage.

## 2023-12-17 NOTE — ED Notes (Signed)
Patient transferred from [facility] ED to [facility]. While at ED patient endorsed SI with no plan and auditory command hallucinations. Patient denied HI and VH. Upon arrival to [facility] patient denied SI/HI and AVH. Calm, cooperative throughout interview process. Patient reports being off Invega shot for 3 months leading to "all kinds of mess" including cocaine use which patient reports as a way to self medicate. Skin assessment completed. Belongings sheet completed. Oriented to unit. Meal and drink offered. Patient verbally contracted for safety. Patient in no acute distress. Environment secured, safety checks in place per facility policy.

## 2023-12-17 NOTE — ED Notes (Signed)
Lunch tray provided at this time

## 2023-12-17 NOTE — ED Notes (Signed)
Dinner tray provided at this time.

## 2023-12-17 NOTE — ED Notes (Signed)
 Patient was provided dinner

## 2023-12-18 ENCOUNTER — Encounter (HOSPITAL_COMMUNITY): Payer: Self-pay

## 2023-12-18 DIAGNOSIS — Z720 Tobacco use: Secondary | ICD-10-CM | POA: Diagnosis not present

## 2023-12-18 DIAGNOSIS — F1410 Cocaine abuse, uncomplicated: Secondary | ICD-10-CM | POA: Diagnosis not present

## 2023-12-18 DIAGNOSIS — F250 Schizoaffective disorder, bipolar type: Secondary | ICD-10-CM | POA: Diagnosis not present

## 2023-12-18 DIAGNOSIS — Z91148 Patient's other noncompliance with medication regimen for other reason: Secondary | ICD-10-CM | POA: Diagnosis not present

## 2023-12-18 LAB — LIPID PANEL
Cholesterol: 126 mg/dL (ref 0–200)
HDL: 39 mg/dL — ABNORMAL LOW (ref >40)
LDL Cholesterol: 42 mg/dL (ref 0–99)
Total CHOL/HDL Ratio: 3.2 {ratio}
Triglycerides: 226 mg/dL — ABNORMAL HIGH (ref <150)
VLDL: 45 mg/dL — ABNORMAL HIGH (ref 0–40)

## 2023-12-18 LAB — TSH: TSH: 2.961 u[IU]/mL (ref 0.350–4.500)

## 2023-12-18 MED ORDER — NICOTINE 21 MG/24HR TD PT24
21.0000 mg | MEDICATED_PATCH | Freq: Every day | TRANSDERMAL | Status: DC | PRN
Start: 2023-12-18 — End: 2023-12-19

## 2023-12-18 NOTE — Discharge Instructions (Signed)
[facility] [address], [place], [address] [phone] phone  New Patient Assessment/Therapy Walk-Ins:  Monday and Wednesday: 8 am until slots are full. Every 1st and 2nd Fridays of the month: 1 pm - 5 pm.  NO ASSESSMENT/THERAPY WALK-INS ON TUESDAYS OR THURSDAYS  New Patient Assessment/Medication Management Walk-Ins:  Monday - Friday:  8 am - 11 am.  For all walk-ins, we ask that you arrive by 7:30 am because patients will be seen in the order of arrival.  Availability is limited; therefore, you may not be seen on the same day that you walk-in.  Our goal is to serve and meet the needs of our community to the best of our [place] ability.  SUBSTANCE USE TREATMENT for Medicaid and State Funded/IPRS  [facility] [address], [place], [address] [phone] phone NOTE: [facility] is no longer offering IOP services.  Serves those who are low-income or have no insurance.  [facility] [address] [place], [place], [address] [phone] phone [phone] fax NOTE: Does have Substance Abuse-Intensive Outpatient Program [facility]) as well as transitional housing if eligible.  [facility] [address] [place], [place], [address] [phone] phone [phone] fax  [facility] [phone] [address] [place], [place], [address] [phone] phone [phone] fax  HALFWAY HOUSES:  [facility] [phone]  [employer].oxfordvacancies.com  12 STEP PROGRAMS:  Alcoholics Anonymous of [place] SoftwareChalet.be  Narcotics Anonymous of [place] HitProtect.dk  Al-Anon of [place], [place] www.[place]alanon.org/find-meetings.html  Nar-Anon https://nar-anon.org/find-a-meetin  List of Residential placements:   [facility] in [place]: [phone]  [facility]: [phone]  [place], [place]  [phone]: Male and male facility; 30-day program: (uninsured and Medicaid such as Laurena Bering, [place], [place], partners)  [facility]: [phone]; men and women's facility; 28 days; Can have Medicaid tailored plan Tour manager or Partners)  [facility]: [phone] Karoline Caldwell or Larita Fife; 28 day program; must be fully detox; tailored Medicaid or no insurance  [address] in [place], [place]; [phone]; 28 day all males program; no insurance accepted  [facility] Referral in [place]: Gabriel Rung [phone] (no insurance or Medicaid only); 90 days; outpatient services but provide housing in apartments [place]  [facility] Admission: [phone]: Patient must complete phone screening for placement: [place], [place]; 6 month program; uninsured, Medicaid, and [employer].   [facility]: no insurance required; [phone]  [facility]: [phone]; Intake: Molly Maduro; Must fill out application online; Alecia Lemming Delay [phone] [facility] in [place], [place]: [phone]; Admissions Coordinators Mr. Maurine Minister or Barron Alvine; 90 day program.  [facility]: [place], [place] [phone]; Co-Ed 9 month to a year program; Online application; Men entry fee is $500 (6-68months);  [employer]: [address] [place], [place] [address]; no fee or insurance required; minimum of 2 years; Highly structured; work based; Intake Coordinator is Thayer Ohm [phone]  [facility] in [place], [place]: [phone]; Fax number is [phone]; website: www.[facility].org; Requires 3-6 page autobiography; 2 year program (18 months and then 9month transitional housing); Admission fee is $300; no insurance needed; work Automotive engineer in [place], [place]: [employer] Desk Staff: Danise Edge [phone]: They have a Men's Regenerations Program 6-34months. Free program; There is an initial $300 fee however, they are willing to work  with patients regarding that. Application is online.  [facility]: Admissions [phone] Doran Heater ext [phone]; Any 7-90 day program is out of pocket; 12  month program is free of charge; there is a $275 entry fee; Patient is responsible for own transportation

## 2023-12-18 NOTE — BH IP Treatment Plan (Signed)
Interdisciplinary Treatment and Diagnostic Plan Update  12/18/2023 Time of Session: 9:43AM Cory Pruitt MRN: [id number]  Diagnosis:  Final diagnoses:  Stimulant use disorder  Cocaine abuse (HCC)  Schizoaffective disorder, bipolar type (HCC)  Tobacco use disorder  History of medication noncompliance     Current Medications:  Current Facility-Administered Medications  Medication Dose Route Frequency Provider Last Rate Last Admin   acetaminophen (TYLENOL) tablet 650 mg  650 mg Oral Q6H PRN Carrion-Carrero, Margely, MD       alum & mag hydroxide-simeth (MAALOX/MYLANTA) 200-200-20 MG/5ML suspension 30 mL  30 mL Oral Q4H PRN Carrion-Carrero, Margely, MD       atorvastatin (LIPITOR) tablet 20 mg  20 mg Oral Daily Sindy Guadeloupe, NP   20 mg at 12/18/23 0910   haloperidol (HALDOL) tablet 5 mg  5 mg Oral TID PRN Carrion-Carrero, Karle Starch, MD       And   diphenhydrAMINE (BENADRYL) capsule 50 mg  50 mg Oral TID PRN Carrion-Carrero, Margely, MD       haloperidol lactate (HALDOL) injection 5 mg  5 mg Intramuscular TID PRN Carrion-Carrero, Margely, MD       And   diphenhydrAMINE (BENADRYL) injection 50 mg  50 mg Intramuscular TID PRN Carrion-Carrero, Margely, MD       And   LORazepam (ATIVAN) injection 2 mg  2 mg Intramuscular TID PRN Carrion-Carrero, Margely, MD       haloperidol lactate (HALDOL) injection 10 mg  10 mg Intramuscular TID PRN Carrion-Carrero, Margely, MD       And   diphenhydrAMINE (BENADRYL) injection 50 mg  50 mg Intramuscular TID PRN Carrion-Carrero, Margely, MD       And   LORazepam (ATIVAN) injection 2 mg  2 mg Intramuscular TID PRN Carrion-Carrero, Karle Starch, MD       hydrOXYzine (ATARAX) tablet 25 mg  25 mg Oral TID PRN Carrion-Carrero, Margely, MD       lisinopril (ZESTRIL) tablet 10 mg  10 mg Oral Daily Sindy Guadeloupe, NP   10 mg at 12/18/23 0910   magnesium hydroxide (MILK OF MAGNESIA) suspension 30 mL  30 mL Oral Daily PRN Carrion-Carrero, Karle Starch, MD       metFORMIN  (GLUCOPHAGE) tablet 500 mg  500 mg Oral BID WC Sindy Guadeloupe, NP   500 mg at 12/18/23 1609   nicotine (NICODERM CQ - dosed in mg/24 hours) patch 21 mg  21 mg Transdermal Daily PRN Carrion-Carrero, Karle Starch, MD       paliperidone (INVEGA) 24 hr tablet 6 mg  6 mg Oral Daily Carrion-Carrero, Margely, MD   6 mg at 12/18/23 1027   traZODone (DESYREL) tablet 50 mg  50 mg Oral QHS PRN Carrion-Carrero, Karle Starch, MD       Current Outpatient Medications  Medication Sig Dispense Refill   atorvastatin (LIPITOR) 20 MG tablet Take 20 mg by mouth daily.     lisinopril (ZESTRIL) 10 MG tablet Take 10 mg by mouth daily.     metFORMIN (GLUCOPHAGE) 500 MG tablet Take 1 tablet (500 mg total) by mouth 2 (two) times daily with a meal. 60 tablet 1   paliperidone (INVEGA SUSTENNA) 156 MG/ML SUSY injection Inject 156 mg into the muscle once.     PTA Medications: Prior to Admission medications   Medication Sig Start Date End Date Taking? Authorizing Provider  atorvastatin (LIPITOR) 20 MG tablet Take 20 mg by mouth daily. 08/04/23   [provider]  lisinopril (ZESTRIL) 10 MG tablet Take 10 mg by mouth daily.  08/04/23   [provider]  metFORMIN (GLUCOPHAGE) 500 MG tablet Take 1 tablet (500 mg total) by mouth 2 (two) times daily with a meal. 01/18/21   Clapacs, Jackquline Denmark, MD  paliperidone (INVEGA SUSTENNA) 156 MG/ML SUSY injection Inject 156 mg into the muscle once.    [provider]    Patient Stressors: Marital or family conflict   Substance abuse    Patient Strengths: Ability for insight  Active sense of humor  Average or above average intelligence  Capable of independent living  Communication skills  General fund of knowledge  Motivation for treatment/growth   Treatment Modalities: Medication Management, Group therapy, Case management,  1 to 1 session with clinician, Psychoeducation, Recreational therapy.   Physician Treatment Plan for Primary and Secondary Diagnosis:  Final  diagnoses:  Stimulant use disorder  Cocaine abuse (HCC)  Schizoaffective disorder, bipolar type (HCC)  Tobacco use disorder  History of medication noncompliance   Long Term Goal(s): Improvement in symptoms so as ready for discharge  Short Term Goals: Patient will verbalize feelings in meetings with treatment team members. Patient will attend at least of 50% of the groups daily. Pt will complete the PHQ9 on admission, day 3 and discharge. Patient will participate in completing the [place] Suicide Severity Rating Scale Patient will score a low risk of violence for 24 hours prior to discharge Patient will take medications as prescribed daily.  Medication Management: Evaluate patient's response, side effects, and tolerance of medication regimen.  Therapeutic Interventions: 1 to 1 sessions, Unit Group sessions and Medication administration.  Evaluation of Outcomes: Progressing  LCSW Treatment Plan for Primary Diagnosis:  Final diagnoses:  Stimulant use disorder  Cocaine abuse (HCC)  Schizoaffective disorder, bipolar type (HCC)  Tobacco use disorder  History of medication noncompliance    Long Term Goal(s): Safe transition to appropriate next level of care at discharge.   Short Term Goals: Facilitate acceptance of mental health diagnosis and concerns through verbal commitment to aftercare plan and appointments at discharge., Patient will identify one social support prior to discharge to aid in patient's recovery., Patient will attend AA/NA groups as scheduled., Identify minimum of 2 triggers associated with mental health/substance abuse issues with treatment team members., and Increase skills for wellness and recovery by attending 50% of scheduled groups.   Therapeutic Interventions: Assess for all discharge needs, 1 to 1 time with Child psychotherapist, Explore available resources and support systems, Assess for adequacy in community support network, Educate family and significant other(s) on  suicide prevention, Complete Psychosocial Assessment, Interpersonal group therapy.   Evaluation of Outcomes: Progressing     Progress in Treatment: Attending groups: Yes. Participating in groups: Yes. Taking medication as prescribed: Yes. Toleration medication: Yes. Family/Significant other contact made: Yes, will contact:  Patient provided permission for team to follow up with his Friend Debroah Baller regarding his outpatient services [phone].  Patient understands diagnosis: Yes. Discussing patient identified problems/goals with staff: Yes. Medical problems stabilized or resolved: Yes. Denies suicidal/homicidal ideation: Yes. Issues/concerns per patient self-inventory: Yes. Other: Patient reports a need to get back on his meds and a resume care with his current providers.    New problem(s) identified: No, Describe:  other than reported on admission.    New Short Term/Long Term Goal(s): Safe transition to appropriate next level of care at discharge, Engage patient in therapeutic group addressing interpersonal concerns. Engage patient in aftercare planning with referrals and resources, Increase ability to appropriately verbalize feelings, Facilitate acceptance of mental health diagnosis and  concerns and Identify triggers associated with mental health/substance abuse issues.     Patient Goals: Patient reports his goal is to return back home with resumption of care with is current providers.    Discharge Plan or Barriers: None to report. Patient will be stabilized on medication and re-established with his providers after discharge. Patient reports he is not interested in residential placement at this time.    Reason for Continuation of Hospitalization: Medication stabilization Withdrawal symptoms   Estimated Length of Stay: 3-5 days Last 3 [place] Suicide Severity Risk Score: Flowsheet Row ED from 12/17/2023 in [facility] Most recent reading at 12/17/2023   6:14 PM ED from 12/17/2023 in [facility] at [facility] Most recent reading at 12/17/2023  2:36 PM ED from 01/06/2023 in [facility] Emergency Department at [facility] Most recent reading at 01/06/2023  9:09 AM  C-SSRS RISK CATEGORY Low Risk Error: Q3, 4, or 5 should not be populated when Q2 is No No Risk       Last PHQ 2/9 Scores:    12/18/2023    3:49 PM  Depression screen PHQ 2/9  Decreased Interest 1  Down, Depressed, Hopeless 2  PHQ - 2 Score 3  Altered sleeping 1  Tired, decreased energy 2  Change in appetite 1  Feeling bad or failure about yourself  2  Trouble concentrating 1  Moving slowly or fidgety/restless 0  Suicidal thoughts 0  PHQ-9 Score 10  Difficult doing work/chores Somewhat difficult    Scribe for Treatment Team: Loleta Dicker, LCSW 12/18/2023 5:41 PM

## 2023-12-18 NOTE — ED Notes (Signed)
Patient is in the bedroom calm and sleeping.NAD. Respirations even and unlabored. Will continue to monitor for safety.

## 2023-12-18 NOTE — ED Notes (Signed)
Pt sleeping at present, no distress noted, respirations even & unlabored.  Monitoring for safety. ?

## 2023-12-18 NOTE — ED Notes (Signed)
Patient spent long periods on the phone this morning.  He then ate lunch and has now returned to room and sleep.  No distress.  Tolerating PO meds.

## 2023-12-18 NOTE — Group Note (Signed)
Group Topic: Change and Accountability  Group Date: 12/18/2023 Start Time: 1930 End Time: 2130 Facilitators: Rae Lips B  Department: [facility]  Number of Participants: 2  Group Focus: abuse issues, acceptance, activities of daily living skills, affirmation, chemical dependency issues, clarity of thought, communication, coping skills, daily focus, family, feeling awareness/expression, forgiveness, goals/reality orientation, and healthy friendships Treatment Modality:  Engineer, manufacturing systems Therapy, Leisure Counsellor, Patient-Centered Therapy, Solution-Focused Therapy, and Spiritual Interventions utilized were patient education, problem solving, reality testing, story telling, and support Purpose: enhance coping skills, explore maladaptive thinking, express feelings, express irrational fears, increase insight, and reinforce self-care  Name: Cory Pruitt Date of Birth: [birth year]  MR: [id number]    Level of Participation: PT did not attend groups.  Quality of Participation: cooperative Interactions with others: gave feedback Mood/Affect: appropriate Triggers (if applicable): NA Cognition: coherent/clear Progress: Minimal Response: NA Plan: patient will be encouraged to go to groups.   Patients Problems:  Patient Active Problem List   Diagnosis Date Noted   Cocaine abuse with cocaine-induced mood disorder (HCC) 12/17/2023   Schizoaffective disorder (HCC) 12/17/2023   Schizophrenia (HCC)    Diabetes mellitus without complication (HCC)    Hypertension

## 2023-12-18 NOTE — Group Note (Signed)
Group Topic: Recovery Basics  Group Date: 12/18/2023 Start Time: 1200 End Time: 1225 Facilitators: Jenean Lindau, RN  Department: [facility]  Number of Participants: 3  Group Focus: chemical dependency education Treatment Modality:  Behavior Modification Therapy Interventions utilized were patient education Purpose: enhance coping skills, explore maladaptive thinking, express feelings, and increase insight  Name: Cory Pruitt Date of Birth: 1975/12/09  MR: [id number]    Level of Participation:  Patient left group shortly after it began Quality of Participation:  Interactions with others:  Mood/Affect:  Triggers (if applicable):  Cognition:  Progress:  Response:  Plan:   Patients Problems:  Patient Active Problem List   Diagnosis Date Noted   Cocaine abuse with cocaine-induced mood disorder (HCC) 12/17/2023   Schizoaffective disorder (HCC) 12/17/2023   Schizophrenia (HCC)    Diabetes mellitus without complication (HCC)    Hypertension

## 2023-12-18 NOTE — ED Provider Notes (Signed)
Facility Based Crisis Admission H&P  Date: 12/18/23 Patient Name: Cory Pruitt MRN: [id number] Chief Complaint: "I need to get back on my meds"  Diagnoses:  Final diagnoses:  Stimulant use disorder  Cocaine abuse (HCC)  Schizoaffective disorder, bipolar type (HCC)  Tobacco use disorder  History of medication noncompliance   HPI:  BYFORD SCHOOLS is a 48 yo male with a PPHx of schizoaffective disorder, bipolar type, stimulant use disorder (cocaine type), and with prior psychiatric hospitalizations who presented to [facility] ED for worsening symptoms of psychosis in the setting of medication noncompliance, reporting being 3 months without [place] and relapsing on cocaine.  He was admitted to [facility] requesting restarting his psychiatric meds with interest in following up with [facility] IOP.  PMHx significant for HLD, T2DM HTN.  UDS on admission was positive for cocaine  Patient reports he is the father of 5 children ages 6, 102, 55, 23, and 80 years old.  He reports that he got custody of his 84 year old son 8 months ago and has been caring for him by himself.  He also reports that his other children have been visiting him, most recently one of his teenage sons came to visit, which he reports stressed him out.  He confirms a diagnosis of schizoaffective disorder, historically doing well on [place].  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  He reports missing 3 month's worth of injections due to the scheduling conflicts.  Patient was previously receiving services through an ACT team in 2014 but was discharged at that time, he is unsure if he is currently connected with an ACT team, only able to identify a peer support specialist named Roosvelt Harps  [phone]  who he has been working with for the past 6-7 years.  Patient gives consent to contact [facility] for collateral information.  Patient reports that he has been "self-medicating" with cocaine for the past 3 months, overall reports a 10-year history of off/on use.  He reports using cocaine infrequently, unable to specify how often in a week he will use, but reports 0.5 g for $25 will last him 5 to 6 hours.  He reports he will leave his son inside the home and go outside to use cocaine in his car.  He does not specify whether or not he continues to care for his son throughout the day while intoxicated on cocaine.  In the setting of these aforementioned stressors, patient reports he began to experience auditory and visual hallucinations, and worsening paranoia, prompting him to contact his 37 year old son's mother to pick him up from the home.  Patient confirms his 39 year old son is currently with his mother while he is hospitalized.  Patient reports he then proceeded to call the sheriff to transfer him to the ED.  He is motivated to get back on his psychotropic meds, believes he would be more compliant on his medications if he were on p.o. meds and request to continue on oral Invega during this hospitalization.  He denies any suicidal ideations or homicidal ideations.  He denies any ongoing auditory or visual hallucinations, denies paranoia, or delusional thought processes including thought insertion, thought withdrawal, thought broadcasting, and ideas of reference.  Substance Use Hx: Longest period of sobriety 2.5 years long about 5-6 years ago, what kept him sober was being compliant on meds, went to church and work. Alcohol: Denies Tobacco: 1ppd of ciagerretes for the past 10 years. Cannabis: Reports smoking occasionally, no recent use Cocaine: Reports 10-year history of intermittent use, $25 buys him half a gram to use for the day.  Last use prior to arriving to [facility]. Methamphetamines:  Denies Psilocybin (mushrooms): Denies Ecstasy (MDMA / molly): Denies LSD (acid): never tried: Denies Opiates (fentanyl / heroin): Denies Benzos (Xanax, Klonopin): Denies Prescribed meds abuse: Denies IV Drug Use Hx: Denies Rx drug abuse: Denies Rehab hx: Recalls [facility] ago, is unable to specify how long ago.  Past Psychiatric Hx: Current Psychiatrist: To recall, unable to identify it on chart review Previous Psychiatric Diagnoses: Schizo active disorder, bipolar type Current psychiatric medications: Invega Sustenna 156 mg q. 28 days Psychiatric medication history/compliance: Psychiatric Hospitalization hx: None identified on chart review History of suicide: Denies NSSIB Hx: Denies History of homicide or aggression: Denies   Past Medical History: Denies current PCP.  Reports no chronic medical conditions. Allergies: Denies Trauma: Denies Seizures: Denies  Family Medical History: Denies  Family Psychiatric History: Psychiatric Dx: mother (deceased)-schizophrenia, younger sister - schizophrenia Suicide Hx: reports sister committed uicide Violence/Aggression: Substance use:  Social History: Living Situation: Lives with 47 year old son in [place] Social Support: Identifies Roosvelt Harps [phone]  as main source of support Occupational hx: He reports he gets SSI, he is unemployed does voluntary work. He receives 1k monthly. Marital Status: Single Children: Pt has 5 children, they are all in [place] county 26, 17, 16, 60, 3 yo. 35 yo son lives with him, all of his other children are with their respective mothers.  Legal: Denies legal charges or upcoming court dates. Military: Denies  Access to firearms: Denies  PHQ 2-9:  Flowsheet Row ED from 12/17/2023 in [facility]  Thoughts that you would be better off dead, or of hurting yourself in some way Not at all  PHQ-9 Total Score 10       Flowsheet Row ED from  12/17/2023 in [facility] Most recent reading at 12/17/2023  6:14 PM ED from 12/17/2023 in [facility] Emergency Department at [facility] Most recent reading at 12/17/2023  2:36 PM ED from 01/06/2023 in [facility] Emergency Department at [facility] Most recent reading at 01/06/2023  9:09 AM  C-SSRS RISK CATEGORY Low Risk Error: Q3, 4, or 5 should not be populated when Q2 is No No Risk         Total Time spent with patient: 30 minutes  Musculoskeletal  Strength & Muscle Tone: within normal limits Gait & Station: normal Patient leans: N/A  Psychiatric Specialty Exam  Presentation General Appearance: Appropriate for Environment  Eye Contact:Good  Speech:Clear and Coherent; Normal Rate  Speech Volume:Normal  Handedness:-- (not assessed)   Mood and Affect  Mood:-- ("Better")  Affect:Congruent; Full Range   Thought Process  Thought Processes:Linear  Descriptions of Associations:Intact  Orientation:None  Thought Content:Logical  Diagnosis of Schizophrenia or Schizoaffective disorder in past: Yes   Hallucinations:Hallucinations: None  Ideas of Reference:None  Suicidal Thoughts:Suicidal Thoughts: No  Homicidal Thoughts:Homicidal Thoughts: No   Sensorium  Memory:Immediate Good; Recent Good; Remote Good  Judgment:Fair  Insight:Fair   Executive Functions  Concentration:Fair  Attention Span:Fair  Recall:Fair  Fund of Knowledge:Fair  Language:Fair   Psychomotor Activity  Psychomotor Activity:Psychomotor Activity: Normal  Assets  Assets:Desire for Improvement; Resilience; Manufacturing systems engineer; Housing; Talents/Skills   Sleep  Sleep:Sleep: Fair    Physical Exam Vitals and nursing note reviewed.  Constitutional:      Appearance: He is not ill-appearing.  HENT:     Head: Normocephalic and atraumatic.  Eyes:     Conjunctiva/sclera: Conjunctivae normal.  Pulmonary:     Effort: Pulmonary effort is  normal. No respiratory distress.  Musculoskeletal:        General: Normal range of motion.  Skin:    General: Skin is warm and dry.  Neurological:     General: No focal deficit present.    Review of Systems  Constitutional:  Positive for diaphoresis. Negative for chills, fever, malaise/fatigue and weight loss.  Respiratory:  Negative for shortness of breath.   Cardiovascular:  Negative for chest pain.  Gastrointestinal:  Negative for abdominal pain and diarrhea.  Musculoskeletal:  Negative for myalgias.    Blood pressure (!) 118/95, pulse 77, temperature 98.6 F (37 C), temperature source Oral, resp. rate 20, SpO2 99%. There is no height or weight on file to calculate BMI.  Last Labs:  Admission on 12/17/2023  Component Date Value Ref Range Status   Cholesterol 12/17/2023 126  0 - 200 mg/dL Final   Triglycerides 04/54/0981 226 (H)  <150 mg/dL Final   HDL 19/14/7829 39 (L)  >40 mg/dL Final   Total CHOL/HDL Ratio 12/17/2023 3.2  RATIO Final   VLDL 12/17/2023 45 (H)  0 - 40 mg/dL Final   LDL Cholesterol 12/17/2023 42  0 - 99 mg/dL Final   Comment:        Total Cholesterol/HDL:CHD Risk Coronary Heart Disease Risk Table                     Men   Women  1/2 Average Risk   3.4   3.3  Average Risk       5.0   4.4  2 X Average Risk   9.6   7.1  3 X Average Risk  23.4   11.0        Use the calculated Patient Ratio above and the CHD Risk Table to determine the patient's CHD Risk.        ATP III CLASSIFICATION (LDL):  <100     mg/dL   Optimal  562-130  mg/dL   Near or Above                    Optimal  130-159  mg/dL   Borderline  865-784  mg/dL   High  >696     mg/dL   Very High Performed at [facility], [address] [place], [place] [address]    TSH 12/17/2023 2.961  0.350 - 4.500 uIU/mL Final   Comment: Performed by a 3rd Generation assay with a functional sensitivity of <=0.01 uIU/mL. Performed at [facility], [address], [place], [place] [address]    Admission on 12/17/2023, Discharged on 12/17/2023  Component Date Value Ref Range Status   Sodium 12/17/2023 134 (L)  135 - 145 mmol/L Final   Potassium 12/17/2023 3.7  3.5 - 5.1 mmol/L Final   Chloride 12/17/2023 102  98 - 111 mmol/L Final   CO2 12/17/2023 21 (L)  22 - 32 mmol/L Final   Glucose, Bld 12/17/2023 258 (H)  70 - 99 mg/dL Final   Glucose reference range applies only to samples taken after fasting for at least  8 hours.   BUN 12/17/2023 13  6 - 20 mg/dL Final   Creatinine, Ser 12/17/2023 1.03  0.61 - 1.24 mg/dL Final   Calcium 16/08/9603 9.2  8.9 - 10.3 mg/dL Final   Total Protein 54/07/8118 7.9  6.5 - 8.1 g/dL Final   Albumin 14/78/2956 4.2  3.5 - 5.0 g/dL Final   AST 21/30/8657 14 (L)  15 - 41 U/L Final   ALT 12/17/2023 17  0 - 44 U/L Final   Alkaline Phosphatase 12/17/2023 52  38 - 126 U/L Final   Total Bilirubin 12/17/2023 0.9  0.0 - 1.2 mg/dL Final   GFR, Estimated 12/17/2023 >60  >60 mL/min Final   Comment: (NOTE) Calculated using the CKD-EPI Creatinine Equation (2021)    Anion gap 12/17/2023 11  5 - 15 Final   Performed at [facility], [address] [place], [place] [address]   Alcohol, Ethyl (B) 12/17/2023 <10  <10 mg/dL Final   Comment: (NOTE) Lowest detectable limit for serum alcohol is 10 mg/dL.  For medical purposes only. Performed at [facility], [address] [place], [place] [address]    Salicylate Lvl 12/17/2023 <7.0 (L)  7.0 - 30.0 mg/dL Final   Performed at [facility], [address] [place], [place] [address]   Acetaminophen (Tylenol), Serum 12/17/2023 <10 (L)  10 - 30 ug/mL Final   Comment: (NOTE) Therapeutic concentrations vary significantly. A range of 10-30 ug/mL  may be an effective concentration for many patients. However, some  are best treated at concentrations outside of this range. Acetaminophen concentrations >150 ug/mL at 4 hours after ingestion  and >50 ug/mL at 12 hours after ingestion are often  associated with  toxic reactions.  Performed at [facility], [address] [place], [place] [address]    WBC 12/17/2023 7.9  4.0 - 10.5 K/uL Final   RBC 12/17/2023 5.05  4.22 - 5.81 MIL/uL Final   Hemoglobin 12/17/2023 14.5  13.0 - 17.0 g/dL Final   HCT 72/53/6644 40.8  39.0 - 52.0 % Final   MCV 12/17/2023 80.8  80.0 - 100.0 fL Final   MCH 12/17/2023 28.7  26.0 - 34.0 pg Final   MCHC 12/17/2023 35.5  30.0 - 36.0 g/dL Final   RDW 03/47/4259 13.2  11.5 - 15.5 % Final   Platelets 12/17/2023 204  150 - 400 K/uL Final   nRBC 12/17/2023 0.0  0.0 - 0.2 % Final   Performed at [facility], [address] [place], [place] [address]   Tricyclic, Ur Screen 12/17/2023 NONE DETECTED  NONE DETECTED Final   Amphetamines, Ur Screen 12/17/2023 NONE DETECTED  NONE DETECTED Final   MDMA (Ecstasy)Ur Screen 12/17/2023 NONE DETECTED  NONE DETECTED Final   Cocaine Metabolite,Ur [place] 12/17/2023 POSITIVE (A)  NONE DETECTED Final   Opiate, Ur Screen 12/17/2023 NONE DETECTED  NONE DETECTED Final   Phencyclidine (PCP) Ur S 12/17/2023 NONE DETECTED  NONE DETECTED Final   Cannabinoid 50 Ng, Ur [place] 12/17/2023 NONE DETECTED  NONE DETECTED Final   Barbiturates, Ur Screen 12/17/2023 NONE DETECTED  NONE DETECTED Final   Benzodiazepine, Ur Scrn 12/17/2023 NONE DETECTED  NONE DETECTED Final   Methadone Scn, Ur 12/17/2023 NONE DETECTED  NONE DETECTED Final   Comment: (NOTE) Tricyclics + metabolites, urine    Cutoff 1000 ng/mL Amphetamines + metabolites, urine  Cutoff 1000 ng/mL MDMA (Ecstasy), urine              Cutoff 500 ng/mL Cocaine Metabolite,  urine          Cutoff 300 ng/mL Opiate + metabolites, urine        Cutoff 300 ng/mL Phencyclidine (PCP), urine         Cutoff 25 ng/mL Cannabinoid, urine                 Cutoff 50 ng/mL Barbiturates + metabolites, urine  Cutoff 200 ng/mL Benzodiazepine, urine              Cutoff 200 ng/mL Methadone, urine                   Cutoff 300 ng/mL  The urine  drug screen provides only a preliminary, unconfirmed analytical test result and should not be used for non-medical purposes. Clinical consideration and professional judgment should be applied to any positive drug screen result due to possible interfering substances. A more specific alternate chemical method must be used in order to obtain a confirmed analytical result. Gas chromatography / mass spectrometry (GC/MS) is the preferred confirm                          atory method. Performed at [facility], [address], [place], [place] [address]     Allergies: Patient has no known allergies.  Medications:  Facility Ordered Medications  Medication   acetaminophen (TYLENOL) tablet 650 mg   alum & mag hydroxide-simeth (MAALOX/MYLANTA) 200-200-20 MG/5ML suspension 30 mL   magnesium hydroxide (MILK OF MAGNESIA) suspension 30 mL   hydrOXYzine (ATARAX) tablet 25 mg   haloperidol (HALDOL) tablet 5 mg   And   diphenhydrAMINE (BENADRYL) capsule 50 mg   haloperidol lactate (HALDOL) injection 5 mg   And   diphenhydrAMINE (BENADRYL) injection 50 mg   And   LORazepam (ATIVAN) injection 2 mg   haloperidol lactate (HALDOL) injection 10 mg   And   diphenhydrAMINE (BENADRYL) injection 50 mg   And   LORazepam (ATIVAN) injection 2 mg   paliperidone (INVEGA) 24 hr tablet 6 mg   traZODone (DESYREL) tablet 50 mg   atorvastatin (LIPITOR) tablet 20 mg   lisinopril (ZESTRIL) tablet 10 mg   metFORMIN (GLUCOPHAGE) tablet 500 mg   nicotine (NICODERM CQ - dosed in mg/24 hours) patch 21 mg   PTA Medications  Medication Sig   metFORMIN (GLUCOPHAGE) 500 MG tablet Take 1 tablet (500 mg total) by mouth 2 (two) times daily with a meal.   paliperidone (INVEGA SUSTENNA) 156 MG/ML SUSY injection Inject 156 mg into the muscle once.   lisinopril (ZESTRIL) 10 MG tablet Take 10 mg by mouth daily.   atorvastatin (LIPITOR) 20 MG tablet Take 20 mg by mouth daily.    Long Term Goals: Improvement in  symptoms so as ready for discharge  Short Term Goals: Patient will verbalize feelings in meetings with treatment team members., Patient will attend at least of 50% of the groups daily., Pt will complete the PHQ9 on admission, day 3 and discharge., Patient will participate in completing the [place] Suicide Severity Rating Scale, Patient will score a low risk of violence for 24 hours prior to discharge, and Patient will take medications as prescribed daily.  Medical Decision Making  Attempted to contact collateral from Roosvelt Harps [phone] , patient's peers support specialist.  LCSW was present when phone call was made, left a HIPAA compliant voicemail. Patient was also informed that this provider would place a CPS report given the information he volunteered of  using cocaine while his son's home.  [employer] [facility] was contacted on 12/18/2023, placed report to [employer].  Psychiatric Diagnoses and Treatment:   Stimulant use disorder cocaine type Supportive PRNs   Schizoaffective disorder, bipolar type Patient has declined to restart Invega Sustenna LAI, opting for p.o. Started Invega 6 mg daily  Tobacco use disorder Smoking cessation encouraged Nicotine patch 21 mg as needed   Medical Issues Being Addressed:   HLD Restarted Lipitor 20 mg daily  T2DM A1c pending Restarted metformin 500 mg twice daily  HTN Restarted lisinopril 10 mg daily   Other PRNs: acetaminophen, 650 mg, Q6H PRN alum & mag hydroxide-simeth, 30 mL, Q4H PRN haloperidol, 5 mg, TID PRN  And diphenhydrAMINE, 50 mg, TID PRN haloperidol lactate, 5 mg, TID PRN  And diphenhydrAMINE, 50 mg, TID PRN  And LORazepam, 2 mg, TID PRN haloperidol lactate, 10 mg, TID PRN  And diphenhydrAMINE, 50 mg, TID PRN  And LORazepam, 2 mg, TID PRN hydrOXYzine, 25 mg, TID PRN magnesium hydroxide, 30 mL, Daily PRN nicotine, 21 mg, Daily PRN traZODone, 50 mg, QHS PRN    Other Labs/Imaging  Reviewed: CMP showing subclinical hyponatremia 134 that can be corrected with diet, LFTs unremarkable CBCIs unremarkable Acetaminophen level and salicylate level negative for toxicity UDS positive for cocaine TSH is WNL Lipid panel and A1c pending  EKG on 12/17/2023 is abnormal, with ventricular rate of 121 and ST abnormalities in multiple leads : QTc 471 Repeat EKG on 12/18/2023 showing NSR and QTc WNL  Disposition: CD-IOP    Recommendations  Based on my evaluation the patient does not appear to have an emergency medical condition.  Lorri Frederick, MD 12/18/23  3:50 PM

## 2023-12-18 NOTE — Tx Team (Signed)
LCSW and Resident met with patient to assess current mood, affect, physical state, and inquire about needs/goals while here in [facility] and after discharge. Patient reports he presented due to needing to get back on his medication.  Patient reports he has been stressed out and overwhelmed, so he has been self-medicating with "powder".  Patient reports he has also been off of his Gean Birchwood medication for about 3 months, and reports he has been trying to get that switched to the pill method instead. Patient reports he is seen by Dr in [facility], however he was unable to name the doctor he is being seen by.  Patient has provided permission for the team to follow up with his friend/boss/peer support of 6 to 7 years Debroah Baller contact number [phone] for collateral information.  Patient reports Mr. Raul Del will have all of his information regarding his outpatient services and what he receives.  Patient reports he "barely uses powder". Patient reports he only uses when he is off of his medication. Patient reports he has had off and on battles with his addiction for about 10 years. However, reports a 2 1/2 year period of sobriety about 5-6 years ago. Patient reports during his time of sobriety, he was attending church, consistent with his medication, and working. Patient reports he has had mental breakdowns recently which leads him to use about a 1/2 gram of powder every now and then with his male friend. Patient was asked about his living situation, and patient reports he lives in the home alone with his 82 year old son.  Team explored where son is when patient is under the influence and is using.  Patient reports the child is in the home while he is outside sitting in the car with his male friend.  Patient was made aware that CPS report would need to be made due to safety concerns.  Patient expressed understanding and stated that he is seen by a social worker who is aware of his situation.  Patient was  explained the protocol of the facility and need for safety planning.  Patient expressed understanding. Patient reports he also has a 10, 17, 33 and a 1-year-old, however reports the other children stay with their mother.  Patient reports a history of schizophrenia and manic-depressive disorder.  Patient reports he has had 2 prior mental health admissions about 5 years ago in [place] due to mental health concerns.  Patient reports his current goal is to get stabilized on his current medication and resume outpatient services with his current provider.  Patient advised team to follow up with his friend Debroah Baller regarding outpatient services.  No other needs were reported by the patient at this time. Patient aware that LCSW will continue to follow and provide support while on [facility] unit.  LCSW and MD attempted to get in contact with the patient's peer support Debroah Baller [phone], however received no answer. Voice message was left at 11:08 am requesting phone call back. LCSW will continue to follow up for collateral.  Fernande Boyden, LCSW Clinical Social Worker [facility] Ph: [phone]

## 2023-12-18 NOTE — ED Notes (Signed)
Patient remains asleep in bed without issue or complaint.  Will monitor and provide safe supportive environment.

## 2023-12-18 NOTE — Group Note (Signed)
Group Topic: Social Support  Group Date: 12/18/2023 Start Time: 1000 End Time: 1041 Facilitators: Vonzell Schlatter B  Department: [facility]  Number of Participants: 4  Group Focus: social skills Treatment Modality:  Psychoeducation Interventions utilized were problem solving and support Purpose: reinforce self-care and relapse prevention strategies  Name: Cory Pruitt Date of Birth: 20-Oct-1976  MR: [id number]    Level of Participation: minimal Quality of Participation: attentive and cooperative Interactions with others: gave feedback Mood/Affect: positive Triggers (if applicable): N/A Cognition: coherent/clear Progress: Moderate Response: Pt stated he is already in a Wellness Recovery Class Plan: follow-up needed  Patients Problems:  Patient Active Problem List   Diagnosis Date Noted   Cocaine abuse with cocaine-induced mood disorder (HCC) 12/17/2023   Schizoaffective disorder (HCC) 12/17/2023   Schizophrenia (HCC)    Diabetes mellitus without complication (HCC)    Hypertension

## 2023-12-19 DIAGNOSIS — F1410 Cocaine abuse, uncomplicated: Secondary | ICD-10-CM | POA: Diagnosis not present

## 2023-12-19 DIAGNOSIS — Z720 Tobacco use: Secondary | ICD-10-CM | POA: Diagnosis not present

## 2023-12-19 DIAGNOSIS — Z91148 Patient's other noncompliance with medication regimen for other reason: Secondary | ICD-10-CM | POA: Diagnosis not present

## 2023-12-19 DIAGNOSIS — F250 Schizoaffective disorder, bipolar type: Secondary | ICD-10-CM | POA: Diagnosis not present

## 2023-12-19 LAB — HEMOGLOBIN A1C
Hgb A1c MFr Bld: 9.3 % — ABNORMAL HIGH (ref 4.8–5.6)
Mean Plasma Glucose: 220 mg/dL

## 2023-12-19 MED ORDER — PALIPERIDONE ER 6 MG PO TB24: 6.0000 mg | ORAL_TABLET | Freq: Every day | ORAL | 0 refills | Status: AC

## 2023-12-19 MED ORDER — NICOTINE 21 MG/24HR TD PT24: 21.0000 mg | MEDICATED_PATCH | Freq: Every day | TRANSDERMAL | 0 refills | Status: AC | PRN

## 2023-12-19 NOTE — Group Note (Signed)
Group Topic: Wellness  Group Date: 12/19/2023 Start Time: 0830 End Time: 0900 Facilitators: Londell Moh, NT  Department: [facility]  Number of Participants: 4  Group Focus: check in and goals/reality orientation Treatment Modality:  Psychoeducation Interventions utilized were patient education Purpose: enhance coping skills, express feelings, and increase insight  Name: Cory Pruitt Date of Birth: 23-Mar-1976  MR: [id number]    Level of Participation: active Quality of Participation: attentive Interactions with others: gave feedback Mood/Affect: appropriate Triggers (if applicable): n/a Cognition: coherent/clear Progress: Significant Response: Pt was able to express his feelings towards discharging today and the goals he has for afterwards. Plan: patient will be encouraged to attend future groups that are conducted before discharge.  Patients Problems:  Patient Active Problem List   Diagnosis Date Noted   Cocaine abuse with cocaine-induced mood disorder (HCC) 12/17/2023   Schizoaffective disorder (HCC) 12/17/2023   Schizophrenia (HCC)    Diabetes mellitus without complication (HCC)    Hypertension

## 2023-12-19 NOTE — ED Notes (Signed)
Patient is in the bedroom calm and sleeping.NAD. Respirations even and unlabored. Will continue to monitor for safety.
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
## 2023-12-19 NOTE — ED Notes (Signed)
Cory Pruitt is visible and calm on approach. He was told he would be discharged to home today. Awaiting orders. He denies SI/HI/AVH. He is contracted for Safety. He is medication adherent. Reports * BMs. Behavior is controlled and will report changes as noted. Safety maintained.

## 2023-12-19 NOTE — ED Notes (Signed)
Pt was provided breakfast.

## 2024-05-19 ENCOUNTER — Ambulatory Visit: Payer: Self-pay
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
## 2024-06-17 ENCOUNTER — Ambulatory Visit: Payer: MEDICAID | Admitting: Family Medicine

## 2024-07-05 ENCOUNTER — Ambulatory Visit: Payer: MEDICAID | Admitting: Family Medicine

## 2024-07-05 DIAGNOSIS — Z0000 Encounter for general adult medical examination without abnormal findings: Secondary | ICD-10-CM

## 2024-07-05 DIAGNOSIS — E119 Type 2 diabetes mellitus without complications: Secondary | ICD-10-CM

## 2024-07-05 DIAGNOSIS — Z1211 Encounter for screening for malignant neoplasm of colon: Secondary | ICD-10-CM

## 2024-07-05 DIAGNOSIS — I10 Essential (primary) hypertension: Secondary | ICD-10-CM

## 2024-07-05 DIAGNOSIS — F203 Undifferentiated schizophrenia: Secondary | ICD-10-CM

## 2024-07-05 DIAGNOSIS — Z1159 Encounter for screening for other viral diseases: Secondary | ICD-10-CM

## 2024-07-15 ENCOUNTER — Other Ambulatory Visit: Payer: Self-pay

## 2024-07-15 ENCOUNTER — Emergency Department
Admission: EM | Admit: 2024-07-15 | Discharge: 2024-07-15 | Disposition: A | Discharge status: Home / Self Care | Payer: MEDICAID

## 2024-07-15 DIAGNOSIS — K0889 Other specified disorders of teeth and supporting structures: Secondary | ICD-10-CM | POA: Diagnosis present

## 2024-07-15 MED ORDER — AMOXICILLIN-POT CLAVULANATE 875-125 MG PO TABS: 1.0000 | ORAL_TABLET | Freq: Two times a day (BID) | ORAL | 0 refills | Status: AC

## 2024-07-15 NOTE — ED Provider Notes (Signed)
 [facility] Provider Note    None    (approximate)   History   Dental Pain   HPI  Cory Pruitt is a 48 y.o. male presenting with fever, chills, left lower tooth pain x 2 days. Patient states the pain started 1 month ago, went away and then came back. Denies pus-like drainage. Does not have a regular dentist. Has been using ibuprofen  800 mg at home for pain. Denies allergies. Denies rhinorrhea/nasal congestion, vision changes, headache, jaw pain, sore throat, odynophagia. No fall or injury.  Physical Exam   Triage Vital Signs: ED Triage Vitals [07/15/24 1249]  Encounter Vitals Group     BP 133/88     Girls Systolic BP Percentile      Girls Diastolic BP Percentile      Boys Systolic BP Percentile      Boys Diastolic BP Percentile      Pulse Rate 74     Resp 18     Temp 98.5 F (36.9 C)     Temp Source Oral     SpO2 98 %     Weight      Height      Head Circumference      Peak Flow      Pain Score 7     Pain Loc      Pain Education      Exclude from Growth Chart     Most recent vital signs: Vitals:   07/15/24 1249  BP: 133/88  Pulse: 74  Resp: 18  Temp: 98.5 F (36.9 C)  SpO2: 98%    General: Awake, no distress.  CV:  Good peripheral perfusion.  Resp:  Normal effort.  Abd:  No distention.  Other:  TMs intact bilaterally. No trismus, drooling. No facial swelling. TTP along tooth #18 or 19, no surrounding erythema. Poor dentition. Uvula midline.   ED Results / Procedures / Treatments   Labs (all labs ordered are listed, but only abnormal results are displayed) Labs Reviewed - No data to display   EKG     RADIOLOGY    PROCEDURES:  Critical Care performed: No  Procedures   MEDICATIONS ORDERED IN ED: Medications - No data to display   IMPRESSION / MDM / ASSESSMENT AND PLAN / ED COURSE  I reviewed the triage vital signs and the nursing notes.                              Differential diagnosis includes, but  is not limited to, dental abscess, gingivitis, peritonsillar abscess, parotitis, sialoadenitis  Patient's presentation is most consistent with acute illness / injury with system symptoms.  Patient with left lower tooth pain that is recurrent. Patient does not have a regular dentist established, states he is looking for another one. No pus like drainage or uvular deviation, less likely peritonsillar abscess. No facial swelling. Nontoxic appearing, no difficulty breathing at this time, all VS within normal range. Provided him with f/u for local dentist here in [place]. Discussed continuing use of Ibuprofen  at home for pain as well as sent Rx for Augmentin . He can also f/u with his PCP as needed.  The patient may return to the emergency department for any new, worsening, or concerning symptoms. Patient was given the opportunity to ask questions; all questions were answered. Emergency department return precautions were discussed with the patient.  Patient is in agreement to the treatment plan.  Patient is stable for discharge.    FINAL CLINICAL IMPRESSION(S) / ED DIAGNOSES   Final diagnoses:  Pain, dental     Rx / DC Orders   ED Discharge Orders          Ordered    amoxicillin -clavulanate (AUGMENTIN ) 875-125 MG tablet  2 times daily        07/15/24 1537             Note:  This document was prepared using Dragon voice recognition software and may include unintentional dictation errors.    Sheron Salm, PA-C 07/15/24 1720    Clarine Ozell LABOR, MD 07/15/24 [phone]

## 2024-07-15 NOTE — ED Notes (Signed)
 PT dc'd by Sheron, [place]

## 2024-07-15 NOTE — ED Triage Notes (Signed)
 Pt to ED via POV from home. Pt reports had a tooth pulled on right upper side a month ago. Pt reports a week or two later started to have left sided dental pain. Pt reports no relief with ibuprofen .

## 2024-07-15 NOTE — Discharge Instructions (Addendum)
 You have been seen in the Emergency Department (ED) today for dental pain.  Please take your prescribed antibiotic.  You should take over-the-counter pain medication such as ibuprofen  according to the label instructions unless a doctor has previously told you to avoid this type of medication (due to stomach ulcers, for example).  Alternatively you can take ibuprofen  600 mg by mouth three times daily with meals for no more than 5 days.  Please see you dentist as soon as possible; only a dentist will be able to fix your problem(s).  Please see below for dental follow up options.  Return to the ED if you develop worsening pain, fever, pus/drainage, difficulty breathing, or other symptoms that concern you.

## 2024-08-18 ENCOUNTER — Other Ambulatory Visit: Payer: Self-pay

## 2024-08-18 ENCOUNTER — Emergency Department
Admission: EM | Admit: 2024-08-18 | Discharge: 2024-08-18 | Disposition: A | Discharge status: Home / Self Care | Payer: MEDICAID | Attending: Emergency Medicine | Admitting: Emergency Medicine

## 2024-08-18 DIAGNOSIS — K0889 Other specified disorders of teeth and supporting structures: Secondary | ICD-10-CM | POA: Diagnosis present

## 2024-08-18 DIAGNOSIS — K0381 Cracked tooth: Secondary | ICD-10-CM | POA: Diagnosis not present

## 2024-08-18 DIAGNOSIS — I10 Essential (primary) hypertension: Secondary | ICD-10-CM | POA: Insufficient documentation

## 2024-08-18 DIAGNOSIS — E119 Type 2 diabetes mellitus without complications: Secondary | ICD-10-CM | POA: Insufficient documentation

## 2024-08-18 DIAGNOSIS — K047 Periapical abscess without sinus: Secondary | ICD-10-CM | POA: Insufficient documentation

## 2024-08-18 LAB — CBC
HCT: 38 % — ABNORMAL LOW (ref 39.0–52.0)
Hemoglobin: 13.7 g/dL (ref 13.0–17.0)
MCH: 29.5 pg (ref 26.0–34.0)
MCHC: 36.1 g/dL — ABNORMAL HIGH (ref 30.0–36.0)
MCV: 81.9 fL (ref 80.0–100.0)
Platelets: 186 K/uL (ref 150–400)
RBC: 4.64 MIL/uL (ref 4.22–5.81)
RDW: 13 % (ref 11.5–15.5)
WBC: 7.6 K/uL (ref 4.0–10.5)
nRBC: 0 % (ref 0.0–0.2)

## 2024-08-18 LAB — BASIC METABOLIC PANEL WITH GFR
Anion gap: 10 (ref 5–15)
BUN: 12 mg/dL (ref 6–20)
CO2: 22 mmol/L (ref 22–32)
Calcium: 8.5 mg/dL — ABNORMAL LOW (ref 8.9–10.3)
Chloride: 108 mmol/L (ref 98–111)
Creatinine, Ser: 0.8 mg/dL (ref 0.61–1.24)
GFR, Estimated: >60 mL/min (ref >60)
Glucose, Bld: 183 mg/dL — ABNORMAL HIGH (ref 70–99)
Potassium: 3.7 mmol/L (ref 3.5–5.1)
Sodium: 140 mmol/L (ref 135–145)

## 2024-08-18 MED ORDER — TRAMADOL HCL 50 MG PO TABS: 50.0000 mg | ORAL_TABLET | Freq: Four times a day (QID) | ORAL | 0 refills | Status: AC | PRN

## 2024-08-18 MED ORDER — CLINDAMYCIN HCL 300 MG PO CAPS: 300.0000 mg | ORAL_CAPSULE | Freq: Three times a day (TID) | ORAL | 0 refills | Status: AC

## 2024-08-18 NOTE — Discharge Instructions (Addendum)
 Please keep the scheduled dental appointment tomorrow.  Take the antibiotic as prescribed until finished, even if the swelling and pain has improved.  Return to the emergency department if symptoms change or worsen and you are unable to see the dentist.

## 2024-08-18 NOTE — ED Triage Notes (Signed)
 Pt to ED for lower left dental pain for a few days. Seen a month ago for similar. Reports possible fevers. Swelling noted

## 2024-08-18 NOTE — ED Provider Notes (Signed)
 [facility] Provider Note    Event Date/Time   First MD Initiated Contact with Patient 08/18/24 1648     (approximate)   History   Dental Pain   HPI  Cory Pruitt is a 48 y.o. male with history of schizophrenia, diabetes, hypertension, cocaine abuse and as listed in EMR presents to the emergency department for treatment and evaluation of dental pain.  Dental pain and facial swelling has been intermittent for about a month.  He was here and took amoxicillin  for a few days which controlled pain and swelling.  He had an upcoming dental appointment but pain and swelling started again 2 days ago.  Subjective fever today.  Pain has not been well-controlled with Tylenol  or ibuprofen .     Physical Exam    Vitals:   08/18/24 1608  BP: (!) 146/103  Pulse: 81  Resp: 18  Temp: 97.9 F (36.6 C)  SpO2: 97%    General: Awake, no distress.  CV:  Good peripheral perfusion.  Resp:  Normal effort.  Abd:  No distention.  Other:  Focal swelling over lower jaw associated with pain in tooth #19.  Swelling does not cross the angle of the jaw.  Sublingual surface is soft.  Speech is clear.  Airway is patent.   ED Results / Procedures / Treatments   Labs (all labs ordered are listed, but only abnormal results are displayed)  Labs Reviewed  CBC - Abnormal; Notable for the following components:      Result Value   HCT 38.0 (*)    MCHC 36.1 (*)    All other components within normal limits  BASIC METABOLIC PANEL WITH GFR - Abnormal; Notable for the following components:   Glucose, Bld 183 (*)    Calcium  8.5 (*)    All other components within normal limits     EKG  Not indicated   RADIOLOGY  Image and radiology report reviewed and interpreted by me. Radiology report consistent with the same.  Not indicated  PROCEDURES:  Critical Care performed: No  Procedures   MEDICATIONS ORDERED IN ED:  Medications - No data to display   IMPRESSION / MDM  / ASSESSMENT AND PLAN / ED COURSE   I have reviewed the triage note and vital signs. Vital signs are stable.  Asymptomatic hypertension with history of the same.   Differential diagnosis includes, but is not limited to, dental abscess, Ludwig's angina, facial cellulitis  Patient's presentation is most consistent with acute illness / injury with system symptoms.  48 year old male presenting to the emergency department for evaluation of dental pain and associated facial swelling.  See HPI for further details.  On exam, he has focal swelling over the lower jaw in association with dental pain of tooth #18 or 19.  No evidence of Ludwig's angina.  Plan will be to treat with clindamycin  since he is a diabetic and his glucose is elevated.  I considered obtaining a CT scan however swelling is focal, speech is clear and there is no evidence of airway involvement.  Additionally, white blood cell count is normal, patient is afebrile and not tachycardic.  Patient was encouraged to see the dentist tomorrow as scheduled.  ER return precautions were given and he will return if the swelling increases, pain increases, he develops fever, or other additional concerns.  Patient is agreeable to this plan.      FINAL CLINICAL IMPRESSION(S) / ED DIAGNOSES   Final diagnoses:  Dental abscess  Rx / DC Orders   ED Discharge Orders          Ordered    clindamycin  (CLEOCIN ) 300 MG capsule  3 times daily        08/18/24 1656    traMADol  (ULTRAM ) 50 MG tablet  Every 6 hours PRN        08/18/24 1657             Note:  This document was prepared using Dragon voice recognition software and may include unintentional dictation errors.   Herlinda Kirk NOVAK, FNP 08/18/24 1708    Arlander Charleston, MD 08/18/24 [phone]
